# Patient Record
Sex: Male | Born: 1985 | Race: Black or African American | Hispanic: No | Marital: Single | Smoking: Never smoker
Health system: Southern US, Community
[De-identification: ages and names within clinical notes are randomized; demographics above are authoritative.]

---

## 2005-03-31 ENCOUNTER — Emergency Department (HOSPITAL_COMMUNITY): Admission: EM | Admit: 2005-03-31 | Discharge: 2005-03-31 | Payer: Self-pay | Admitting: Emergency Medicine

## 2007-06-01 ENCOUNTER — Emergency Department (HOSPITAL_COMMUNITY): Admission: EM | Admit: 2007-06-01 | Discharge: 2007-06-01 | Payer: Self-pay | Admitting: Emergency Medicine

## 2010-12-26 LAB — CBC
Hemoglobin: 13.7
Platelets: 210
RDW: 13.5

## 2010-12-26 LAB — URINALYSIS, ROUTINE W REFLEX MICROSCOPIC
Specific Gravity, Urine: >1.03 — ABNORMAL HIGH
Urobilinogen, UA: 0.2

## 2010-12-26 LAB — URINE MICROSCOPIC-ADD ON

## 2010-12-26 LAB — COMPREHENSIVE METABOLIC PANEL
ALT: 72 — ABNORMAL HIGH
AST: 113 — ABNORMAL HIGH
Albumin: 4
Alkaline Phosphatase: 60
Glucose, Bld: 106 — ABNORMAL HIGH
Potassium: 3.7
Sodium: 139
Total Protein: 7.1

## 2010-12-26 LAB — DIFFERENTIAL
Basophils Relative: 0
Eosinophils Absolute: 0.1
Eosinophils Relative: 1
Monocytes Absolute: 0.7
Monocytes Relative: 5

## 2011-01-28 ENCOUNTER — Ambulatory Visit (HOSPITAL_COMMUNITY)
Admission: RE | Admit: 2011-01-28 | Discharge: 2011-01-28 | Disposition: A | Discharge status: Home / Self Care | Payer: BC Managed Care – PPO | Source: Ambulatory Visit | Attending: Family Medicine | Admitting: Family Medicine

## 2011-01-28 ENCOUNTER — Encounter (HOSPITAL_COMMUNITY): Payer: Self-pay | Admitting: Family Medicine

## 2011-01-28 DIAGNOSIS — I369 Nonrheumatic tricuspid valve disorder, unspecified: Secondary | ICD-10-CM

## 2011-01-28 DIAGNOSIS — R0989 Other specified symptoms and signs involving the circulatory and respiratory systems: Secondary | ICD-10-CM | POA: Insufficient documentation

## 2011-01-28 DIAGNOSIS — R0789 Other chest pain: Secondary | ICD-10-CM | POA: Insufficient documentation

## 2011-01-28 DIAGNOSIS — R0609 Other forms of dyspnea: Secondary | ICD-10-CM | POA: Insufficient documentation

## 2011-01-28 NOTE — Progress Notes (Signed)
Stress Lab Nurses Notes - [facility]  Isaac Cochran 01/28/2011  Reason for doing test: Chest Pain  Type of test: Regular GTX  Nurse performing test: Parke Poisson, RN  Nuclear Medicine Tech: Not Applicable  Echo Tech: Not Applicable  MD performing test: Lilyan Punt  Family MD: Tessie Fass  Test explained and consent signed: yes  IV started: No IV started  Symptoms: SOB & Fatigue  Treatment/Intervention: None  Reason test stopped: fatigue, reached target HR and SOB  After recovery IV was: NA  Patient to return to Nuc. Med at : NA  Patient discharged: Home  Patient's Condition upon discharge was: stable  Comments: During test peak BP 162/72 & HR 191.   Recovery BP 12/68 & HR 112.  Symptoms resolved in recovery.  [name] T

## 2011-01-28 NOTE — Procedures (Signed)
NAME:  Isaac Cochran, Isaac Cochran              ACCOUNT NO.:  [id number]  MEDICAL RECORD NO.:  [id number]  LOCATION:  [facility]                          FACILITY:  [facility]  PHYSICIAN:  Ahsley Attwood A. Gerda Diss, MD    DATE OF BIRTH:  [birth year]  DATE OF PROCEDURE: DATE OF DISCHARGE:                                 STRESS TEST   PROCEDURE:  Stress test standard Bruce protocol.  REASON:  Dyspnea on exertion, intermittent chest pain.  Resting EKG, no acute ST-segment changes are seen.  Resting blood pressure 138/80.  HEART RATE RESPONSE TO EXERCISE:  The patient's heart rate went up quickly before even started exercise and heart rate was above 100, but as the patient exercised, it quickly went up and reached a peak of 190 by approximately 7 minutes 40 seconds into the Bruce protocol.  ST-SEGMENT RESPONSE TO EXERCISE:  The patient did not have any ST- segment changes.  No depressions or elevations.  ARRHYTHMIAS:  None.  Symptomatology - dyspnea with exertion.  Legs feeling tired, fatigued, denied chest pain or pressure.  Recovery phase is uneventful.  BLOOD PRESSURE RESPONSE TO EXERCISE:  Mild elevation in blood pressure during exercise.  Interpretation - poor exercise tolerance, but otherwise a normal stress test.  He was encouraged to start in operating regular exercise into his weekly routine and hopefully with time the patient will get himself into more cardiovascular shape.  His wife relates that he tends to have a lot of sleep apnea symptoms.  We will proceed that after we finish cardiac testing.  He will be getting an ultrasound of heart cause of dyspnea.     Elyssia Strausser A. Gerda Diss, MD    SAL/MEDQ  D:  01/28/2011  T:  01/28/2011  Job:  [id number]

## 2011-01-28 NOTE — Progress Notes (Signed)
*  PRELIMINARY RESULTS* Echocardiogram 2D Echocardiogram has been performed.  Isaac Cochran 01/28/2011, 8:44 AM

## 2013-11-03 ENCOUNTER — Encounter (HOSPITAL_COMMUNITY): Payer: Self-pay | Admitting: Emergency Medicine

## 2013-11-03 ENCOUNTER — Emergency Department (HOSPITAL_COMMUNITY): Payer: BC Managed Care – PPO

## 2013-11-03 ENCOUNTER — Emergency Department (HOSPITAL_COMMUNITY)
Admission: EM | Admit: 2013-11-03 | Discharge: 2013-11-03 | Disposition: A | Discharge status: Home / Self Care | Payer: BC Managed Care – PPO | Attending: Emergency Medicine | Admitting: Emergency Medicine

## 2013-11-03 DIAGNOSIS — S0993XA Unspecified injury of face, initial encounter: Secondary | ICD-10-CM | POA: Insufficient documentation

## 2013-11-03 DIAGNOSIS — Z880 Allergy status to penicillin: Secondary | ICD-10-CM | POA: Insufficient documentation

## 2013-11-03 DIAGNOSIS — M542 Cervicalgia: Secondary | ICD-10-CM

## 2013-11-03 DIAGNOSIS — Y9389 Activity, other specified: Secondary | ICD-10-CM | POA: Insufficient documentation

## 2013-11-03 DIAGNOSIS — Z043 Encounter for examination and observation following other accident: Secondary | ICD-10-CM | POA: Insufficient documentation

## 2013-11-03 DIAGNOSIS — S199XXA Unspecified injury of neck, initial encounter: Principal | ICD-10-CM

## 2013-11-03 DIAGNOSIS — Y9289 Other specified places as the place of occurrence of the external cause: Secondary | ICD-10-CM | POA: Insufficient documentation

## 2013-11-03 MED ORDER — IBUPROFEN 800 MG PO TABS: 800.0000 mg | ORAL_TABLET | Freq: Three times a day (TID) | ORAL | Status: DC | PRN

## 2013-11-03 MED ORDER — CYCLOBENZAPRINE HCL 10 MG PO TABS: 10.0000 mg | ORAL_TABLET | Freq: Three times a day (TID) | ORAL | Status: DC | PRN

## 2013-11-03 MED ORDER — CYCLOBENZAPRINE HCL 10 MG PO TABS
10.0000 mg | ORAL_TABLET | Freq: Once | ORAL | Status: AC
  Administered 2013-11-03: 10 mg via ORAL
  Filled 2013-11-03: qty 1
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
## 2013-11-04 NOTE — ED Provider Notes (Signed)
Medical screening examination/treatment/procedure(s) were performed by non-physician practitioner and as supervising physician I was immediately available for consultation/collaboration.  Earle Burson L Gift Rueckert, MD 11/04/13 0017 

## 2013-11-05 ENCOUNTER — Encounter (HOSPITAL_COMMUNITY): Payer: Self-pay | Admitting: Emergency Medicine

## 2013-11-05 ENCOUNTER — Emergency Department (HOSPITAL_COMMUNITY)
Admission: EM | Admit: 2013-11-05 | Discharge: 2013-11-06 | Disposition: A | Discharge status: Home / Self Care | Payer: BC Managed Care – PPO | Attending: Emergency Medicine | Admitting: Emergency Medicine

## 2013-11-05 DIAGNOSIS — S199XXA Unspecified injury of neck, initial encounter: Principal | ICD-10-CM

## 2013-11-05 DIAGNOSIS — Z880 Allergy status to penicillin: Secondary | ICD-10-CM | POA: Insufficient documentation

## 2013-11-05 DIAGNOSIS — Z79899 Other long term (current) drug therapy: Secondary | ICD-10-CM | POA: Insufficient documentation

## 2013-11-05 DIAGNOSIS — S0993XA Unspecified injury of face, initial encounter: Secondary | ICD-10-CM | POA: Insufficient documentation

## 2013-11-05 DIAGNOSIS — M542 Cervicalgia: Secondary | ICD-10-CM

## 2013-11-05 DIAGNOSIS — Y9289 Other specified places as the place of occurrence of the external cause: Secondary | ICD-10-CM | POA: Insufficient documentation

## 2013-11-05 DIAGNOSIS — Y9389 Activity, other specified: Secondary | ICD-10-CM | POA: Insufficient documentation

## 2013-11-05 DIAGNOSIS — Z791 Long term (current) use of non-steroidal anti-inflammatories (NSAID): Secondary | ICD-10-CM | POA: Insufficient documentation
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
## 2013-11-06 ENCOUNTER — Encounter: Payer: Self-pay | Admitting: Family Medicine

## 2013-11-06 ENCOUNTER — Ambulatory Visit (INDEPENDENT_AMBULATORY_CARE_PROVIDER_SITE_OTHER): Payer: BC Managed Care – PPO | Admitting: Family Medicine

## 2013-11-06 VITALS — BP 132/90 | Ht 66.5 in | Wt 305.0 lb

## 2013-11-06 DIAGNOSIS — S161XXA Strain of muscle, fascia and tendon at neck level, initial encounter: Secondary | ICD-10-CM

## 2013-11-06 DIAGNOSIS — S139XXA Sprain of joints and ligaments of unspecified parts of neck, initial encounter: Secondary | ICD-10-CM

## 2013-11-06 MED ORDER — HYDROCODONE-ACETAMINOPHEN 10-325 MG PO TABS: 1.0000 | ORAL_TABLET | ORAL | Status: DC | PRN
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
## 2013-11-06 NOTE — ED Provider Notes (Signed)
Medical screening examination/treatment/procedure(s) were performed by non-physician practitioner and as supervising physician I was immediately available for consultation/collaboration.   EKG Interpretation None       Bejamin Hackbart K Annessa Satre-Rasch, MD 11/06/13 [id number] 

## 2013-11-20 ENCOUNTER — Encounter: Payer: Self-pay | Admitting: Family Medicine

## 2013-11-20 ENCOUNTER — Ambulatory Visit (INDEPENDENT_AMBULATORY_CARE_PROVIDER_SITE_OTHER): Payer: BC Managed Care – PPO | Admitting: Family Medicine

## 2013-11-20 VITALS — BP 128/88 | Ht 66.5 in | Wt 306.0 lb

## 2013-11-20 DIAGNOSIS — M545 Low back pain, unspecified: Secondary | ICD-10-CM

## 2013-11-20 DIAGNOSIS — S39012S Strain of muscle, fascia and tendon of lower back, sequela: Secondary | ICD-10-CM
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
## 2013-11-20 NOTE — Patient Instructions (Signed)
DASH Eating Plan °DASH stands for "Dietary Approaches to Stop Hypertension." The DASH eating plan is a healthy eating plan that has been shown to reduce high blood pressure (hypertension). Additional health benefits may include reducing the risk of type 2 diabetes mellitus, heart disease, and stroke. The DASH eating plan may also help with weight loss. °WHAT DO I NEED TO KNOW ABOUT THE DASH EATING PLAN? °For the DASH eating plan, you will follow these general guidelines: °· Choose foods with a percent daily value for sodium of less than 5% (as listed on the food label). °· Use salt-free seasonings or herbs instead of table salt or sea salt. °· Check with your health care provider or pharmacist before using salt substitutes. °· Eat lower-sodium products, often labeled as "lower sodium" or "no salt added." °· Eat fresh foods. °· Eat more vegetables, fruits, and low-fat dairy products. °· Choose whole grains. Look for the word "whole" as the first word in the ingredient list. °· Choose fish and skinless chicken or turkey more often than red meat. Limit fish, poultry, and meat to 6 oz (170 g) each day. °· Limit sweets, desserts, sugars, and sugary drinks. °· Choose heart-healthy fats. °· Limit cheese to 1 oz (28 g) per day. °· Eat more home-cooked food and less restaurant, buffet, and fast food. °· Limit fried foods. °· Cook foods using methods other than frying. °· Limit canned vegetables. If you do use them, rinse them well to decrease the sodium. °· When eating at a restaurant, ask that your food be prepared with less salt, or no salt if possible. °WHAT FOODS CAN I EAT? °Seek help from a dietitian for individual calorie needs. °Grains °Whole grain or whole wheat bread. Brown rice. Whole grain or whole wheat pasta. Quinoa, bulgur, and whole grain cereals. Low-sodium cereals. Corn or whole wheat flour tortillas. Whole grain cornbread. Whole grain crackers. Low-sodium crackers. °Vegetables °Fresh or frozen vegetables  (raw, steamed, roasted, or grilled). Low-sodium or reduced-sodium tomato and vegetable juices. Low-sodium or reduced-sodium tomato sauce and paste. Low-sodium or reduced-sodium canned vegetables.  °Fruits °All fresh, canned (in natural juice), or frozen fruits. °Meat and Other Protein Products °Ground beef (85% or leaner), grass-fed beef, or beef trimmed of fat. Skinless chicken or turkey. Ground chicken or turkey. Pork trimmed of fat. All fish and seafood. Eggs. Dried beans, peas, or lentils. Unsalted nuts and seeds. Unsalted canned beans. °Dairy °Low-fat dairy products, such as skim or 1% milk, 2% or reduced-fat cheeses, low-fat ricotta or cottage cheese, or plain low-fat yogurt. Low-sodium or reduced-sodium cheeses. °Fats and Oils °Tub margarines without trans fats. Light or reduced-fat mayonnaise and salad dressings (reduced sodium). Avocado. Safflower, olive, or canola oils. Natural peanut or almond butter. °Other °Unsalted popcorn and pretzels. °The items listed above may not be a complete list of recommended foods or beverages. Contact your dietitian for more options. °WHAT FOODS ARE NOT RECOMMENDED? °Grains °White bread. White pasta. White rice. Refined cornbread. Bagels and croissants. Crackers that contain trans fat. °Vegetables °Creamed or fried vegetables. Vegetables in a cheese sauce. Regular canned vegetables. Regular canned tomato sauce and paste. Regular tomato and vegetable juices. °Fruits °Dried fruits. Canned fruit in light or heavy syrup. Fruit juice. °Meat and Other Protein Products °Fatty cuts of meat. Ribs, chicken wings, bacon, sausage, bologna, salami, chitterlings, fatback, hot dogs, bratwurst, and packaged luncheon meats. Salted nuts and seeds. Canned beans with salt. °Dairy °Whole or 2% milk, cream, half-and-half, and cream cheese. Whole-fat or sweetened yogurt. Full-fat   cheeses or blue cheese. Nondairy creamers and whipped toppings. Processed cheese, cheese spreads, or cheese  curds. °Condiments °Onion and garlic salt, seasoned salt, table salt, and sea salt. Canned and packaged gravies. Worcestershire sauce. Tartar sauce. Barbecue sauce. Teriyaki sauce. Soy sauce, including reduced sodium. Steak sauce. Fish sauce. Oyster sauce. Cocktail sauce. Horseradish. Ketchup and mustard. Meat flavorings and tenderizers. Bouillon cubes. Hot sauce. Tabasco sauce. Marinades. Taco seasonings. Relishes. °Fats and Oils °Butter, stick margarine, lard, shortening, ghee, and bacon fat. Coconut, palm kernel, or palm oils. Regular salad dressings. °Other °Pickles and olives. Salted popcorn and pretzels. °The items listed above may not be a complete list of foods and beverages to avoid. Contact your dietitian for more information. °WHERE CAN I FIND MORE INFORMATION? °National [employer], [employer], [employer]: www.nhlbi.nih.gov/health/health-topics/topics/dash/ °Document Released: 03/12/2011 Document Revised: 08/07/2013 Document Reviewed: 01/25/2013 °Exit[employer]® Patient Information ©2015 [employer]. This information is not intended to replace advice given to you by your health care provider. Make sure you discuss any questions you have with your health care provider. ° °

## 2013-12-19 ENCOUNTER — Ambulatory Visit: Payer: BC Managed Care – PPO | Admitting: Family Medicine

## 2014-01-18 ENCOUNTER — Ambulatory Visit (INDEPENDENT_AMBULATORY_CARE_PROVIDER_SITE_OTHER): Payer: Self-pay | Admitting: Family Medicine

## 2014-01-18 ENCOUNTER — Encounter: Payer: Self-pay | Admitting: Family Medicine

## 2014-01-18 VITALS — BP 130/90 | Ht 66.5 in | Wt 301.4 lb

## 2014-01-18 DIAGNOSIS — M542 Cervicalgia: Secondary | ICD-10-CM

## 2014-01-18 DIAGNOSIS — M545 Low back pain, unspecified: Secondary | ICD-10-CM
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
## 2014-01-19 ENCOUNTER — Other Ambulatory Visit: Payer: Self-pay | Admitting: *Deleted

## 2014-01-19 DIAGNOSIS — M5489 Other dorsalgia: Secondary | ICD-10-CM

## 2014-05-03 DIAGNOSIS — Z029 Encounter for administrative examinations, unspecified: Secondary | ICD-10-CM

## 2020-02-14 ENCOUNTER — Emergency Department (HOSPITAL_COMMUNITY): Payer: BC Managed Care – PPO

## 2020-02-14 ENCOUNTER — Encounter (HOSPITAL_COMMUNITY): Payer: Self-pay

## 2020-02-14 ENCOUNTER — Inpatient Hospital Stay (HOSPITAL_COMMUNITY): Payer: BC Managed Care – PPO

## 2020-02-14 ENCOUNTER — Inpatient Hospital Stay (HOSPITAL_COMMUNITY)
Admission: EM | Admit: 2020-02-14 | Discharge: 2020-02-25 | DRG: 177 | Disposition: A | Discharge status: Home / Self Care | Payer: BC Managed Care – PPO | Attending: Family Medicine | Admitting: Family Medicine

## 2020-02-14 DIAGNOSIS — I160 Hypertensive urgency: Secondary | ICD-10-CM | POA: Diagnosis present

## 2020-02-14 DIAGNOSIS — J9601 Acute respiratory failure with hypoxia: Secondary | ICD-10-CM | POA: Diagnosis present

## 2020-02-14 DIAGNOSIS — U071 COVID-19: Secondary | ICD-10-CM | POA: Diagnosis present

## 2020-02-14 DIAGNOSIS — J1282 Pneumonia due to coronavirus disease 2019: Secondary | ICD-10-CM | POA: Diagnosis present

## 2020-02-14 DIAGNOSIS — Z880 Allergy status to penicillin: Secondary | ICD-10-CM | POA: Diagnosis not present

## 2020-02-14 DIAGNOSIS — K760 Fatty (change of) liver, not elsewhere classified: Secondary | ICD-10-CM | POA: Diagnosis present

## 2020-02-14 DIAGNOSIS — Z6841 Body Mass Index (BMI) 40.0 and over, adult: Secondary | ICD-10-CM | POA: Diagnosis not present

## 2020-02-14 DIAGNOSIS — J189 Pneumonia, unspecified organism: Secondary | ICD-10-CM

## 2020-02-14 DIAGNOSIS — R7989 Other specified abnormal findings of blood chemistry: Secondary | ICD-10-CM

## 2020-02-14 DIAGNOSIS — K219 Gastro-esophageal reflux disease without esophagitis: Secondary | ICD-10-CM | POA: Diagnosis present

## 2020-02-14 DIAGNOSIS — G4700 Insomnia, unspecified: Secondary | ICD-10-CM | POA: Diagnosis present

## 2020-02-14 LAB — COMPREHENSIVE METABOLIC PANEL
ALT: 66 U/L — ABNORMAL HIGH (ref 0–44)
AST: 109 U/L — ABNORMAL HIGH (ref 15–41)
Albumin: 3.8 g/dL (ref 3.5–5.0)
Alkaline Phosphatase: 41 U/L (ref 38–126)
Anion gap: 13 (ref 5–15)
BUN: 22 mg/dL — ABNORMAL HIGH (ref 6–20)
CO2: 25 mmol/L (ref 22–32)
Calcium: 8.5 mg/dL — ABNORMAL LOW (ref 8.9–10.3)
Chloride: 104 mmol/L (ref 98–111)
Creatinine, Ser: 1.14 mg/dL (ref 0.61–1.24)
GFR, Estimated: >60 mL/min (ref >60)
Glucose, Bld: 118 mg/dL — ABNORMAL HIGH (ref 70–99)
Potassium: 4.2 mmol/L (ref 3.5–5.1)
Sodium: 142 mmol/L (ref 135–145)
Total Bilirubin: 0.6 mg/dL (ref 0.3–1.2)
Total Protein: 8.3 g/dL — ABNORMAL HIGH (ref 6.5–8.1)

## 2020-02-14 LAB — RESPIRATORY PANEL BY RT PCR (FLU A&B, COVID)
Influenza A by PCR: NEGATIVE
Influenza B by PCR: NEGATIVE
SARS Coronavirus 2 by RT PCR: POSITIVE — AB

## 2020-02-14 LAB — CBC WITH DIFFERENTIAL/PLATELET
Abs Immature Granulocytes: 0.03 10*3/uL (ref 0.00–0.07)
Basophils Absolute: 0 10*3/uL (ref 0.0–0.1)
Basophils Relative: 0 %
Eosinophils Absolute: 0 10*3/uL (ref 0.0–0.5)
Eosinophils Relative: 0 %
HCT: 45 % (ref 39.0–52.0)
Hemoglobin: 13.9 g/dL (ref 13.0–17.0)
Immature Granulocytes: 0 %
Lymphocytes Relative: 9 %
Lymphs Abs: 0.8 10*3/uL (ref 0.7–4.0)
MCH: 27.5 pg (ref 26.0–34.0)
MCHC: 30.9 g/dL (ref 30.0–36.0)
MCV: 89.1 fL (ref 80.0–100.0)
Monocytes Absolute: 0.5 10*3/uL (ref 0.1–1.0)
Monocytes Relative: 6 %
Neutro Abs: 7.3 10*3/uL (ref 1.7–7.7)
Neutrophils Relative %: 85 %
Platelets: 179 10*3/uL (ref 150–400)
RBC: 5.05 MIL/uL (ref 4.22–5.81)
RDW: 14.4 % (ref 11.5–15.5)
WBC: 8.6 10*3/uL (ref 4.0–10.5)
nRBC: 0.2 % (ref 0.0–0.2)

## 2020-02-14 LAB — FERRITIN: Ferritin: 755 ng/mL — ABNORMAL HIGH (ref 24–336)

## 2020-02-14 LAB — POC SARS CORONAVIRUS 2 AG -  ED: SARS Coronavirus 2 Ag: POSITIVE — AB

## 2020-02-14 LAB — LACTATE DEHYDROGENASE: LDH: 718 U/L — ABNORMAL HIGH (ref 98–192)

## 2020-02-14 LAB — PROCALCITONIN: Procalcitonin: 0.24 ng/mL

## 2020-02-14 LAB — FIBRINOGEN: Fibrinogen: 615 mg/dL — ABNORMAL HIGH (ref 210–475)

## 2020-02-14 LAB — TRIGLYCERIDES: Triglycerides: 151 mg/dL — ABNORMAL HIGH (ref <150)

## 2020-02-14 LAB — D-DIMER, QUANTITATIVE: D-Dimer, Quant: 2.03 ug/mL-FEU — ABNORMAL HIGH (ref 0.00–0.50)

## 2020-02-14 LAB — LACTIC ACID, PLASMA
Lactic Acid, Venous: 1.6 mmol/L (ref 0.5–1.9)
Lactic Acid, Venous: 1.3 mmol/L (ref 0.5–1.9)

## 2020-02-14 LAB — C-REACTIVE PROTEIN: CRP: 5.5 mg/dL — ABNORMAL HIGH (ref <1.0)

## 2020-02-14 MED ORDER — DEXAMETHASONE SODIUM PHOSPHATE 10 MG/ML IJ SOLN
10.0000 mg | Freq: Once | INTRAMUSCULAR | Status: AC
  Administered 2020-02-14: 10 mg via INTRAVENOUS
  Filled 2020-02-14: qty 1

## 2020-02-14 MED ORDER — IPRATROPIUM-ALBUTEROL 20-100 MCG/ACT IN AERS
1.0000 | INHALATION_SPRAY | Freq: Four times a day (QID) | RESPIRATORY_TRACT | Status: DC
  Administered 2020-02-14 – 2020-02-18 (×15): 1 via RESPIRATORY_TRACT
  Filled 2020-02-14: qty 4

## 2020-02-14 MED ORDER — ACETAMINOPHEN 500 MG PO TABS
1000.0000 mg | ORAL_TABLET | Freq: Once | ORAL | Status: AC
  Administered 2020-02-14: 1000 mg via ORAL
  Filled 2020-02-14: qty 2

## 2020-02-14 MED ORDER — ACETAMINOPHEN 325 MG PO TABS: 650.0000 mg | ORAL_TABLET | Freq: Once | ORAL | Status: DC

## 2020-02-14 MED ORDER — IOHEXOL 350 MG/ML SOLN
100.0000 mL | Freq: Once | INTRAVENOUS | Status: AC | PRN
  Administered 2020-02-14: 100 mL via INTRAVENOUS

## 2020-02-14 MED ORDER — ACETAMINOPHEN 325 MG PO TABS
650.0000 mg | ORAL_TABLET | Freq: Four times a day (QID) | ORAL | Status: DC | PRN
  Administered 2020-02-21: 650 mg via ORAL
  Filled 2020-02-14: qty 2

## 2020-02-14 MED ORDER — AZITHROMYCIN 250 MG PO TABS
500.0000 mg | ORAL_TABLET | ORAL | Status: AC
  Administered 2020-02-14 – 2020-02-18 (×5): 500 mg via ORAL
  Filled 2020-02-14 (×5): qty 2

## 2020-02-14 MED ORDER — GUAIFENESIN-DM 100-10 MG/5ML PO SYRP: 10.0000 mL | ORAL_SOLUTION | ORAL | Status: DC | PRN

## 2020-02-14 MED ORDER — DEXAMETHASONE SODIUM PHOSPHATE 10 MG/ML IJ SOLN
6.0000 mg | INTRAMUSCULAR | Status: DC
  Administered 2020-02-15: 6 mg via INTRAVENOUS
  Filled 2020-02-14: qty 1

## 2020-02-14 MED ORDER — LABETALOL HCL 5 MG/ML IV SOLN
10.0000 mg | INTRAVENOUS | Status: DC | PRN
  Administered 2020-02-14: 10 mg via INTRAVENOUS
  Filled 2020-02-14: qty 4

## 2020-02-14 MED ORDER — SODIUM CHLORIDE 0.9% FLUSH
3.0000 mL | Freq: Two times a day (BID) | INTRAVENOUS | Status: DC
  Administered 2020-02-14 – 2020-02-25 (×22): 3 mL via INTRAVENOUS

## 2020-02-14 MED ORDER — SODIUM CHLORIDE 0.9 % IV SOLN: 100.0000 mg | Freq: Every day | INTRAVENOUS | Status: DC

## 2020-02-14 MED ORDER — SODIUM CHLORIDE 0.9 % IV SOLN
200.0000 mg | Freq: Once | INTRAVENOUS | Status: AC
  Administered 2020-02-14: 200 mg via INTRAVENOUS
  Filled 2020-02-14: qty 40

## 2020-02-14 MED ORDER — ENOXAPARIN SODIUM 40 MG/0.4ML ~~LOC~~ SOLN
40.0000 mg | SUBCUTANEOUS | Status: DC
  Administered 2020-02-14: 40 mg via SUBCUTANEOUS
  Filled 2020-02-14: qty 0.4

## 2020-02-14 MED ORDER — ALBUTEROL SULFATE HFA 108 (90 BASE) MCG/ACT IN AERS
4.0000 | INHALATION_SPRAY | RESPIRATORY_TRACT | Status: AC
  Administered 2020-02-14 (×3): 4 via RESPIRATORY_TRACT
  Filled 2020-02-14: qty 6.7

## 2020-02-14 MED ORDER — BARICITINIB 2 MG PO TABS
4.0000 mg | ORAL_TABLET | Freq: Every day | ORAL | Status: DC
  Administered 2020-02-14 – 2020-02-25 (×12): 4 mg via ORAL
  Filled 2020-02-14 (×12): qty 2

## 2020-02-14 MED ORDER — SODIUM CHLORIDE 0.9 % IV SOLN
2.0000 g | INTRAVENOUS | Status: AC
  Administered 2020-02-14 – 2020-02-18 (×5): 2 g via INTRAVENOUS
  Filled 2020-02-14: qty 2
  Filled 2020-02-14 (×5): qty 20

## 2020-02-14 NOTE — H&P (Addendum)
History and Physical        Hospital Admission Note Date: 02/14/2020  Patient name: Isaac Cochran Medical record number: [id number] Date of birth: 07/15/1985 Age: 34 y.o. Gender: male  PCP: Pcp, No    Chief Complaint    Chief Complaint  Patient presents with  . Shortness of Breath  . Fever      HPI:   This is a 35 year old male who is unvaccinated against COVID-19 without any significant past medical history who presented to the ED with 2 weeks of shortness of breath from urgent care.  Has had low-grade fevers at home.  Productive cough as well.  Admits to diarrhea and change in taste and generalized abdominal pain with some nausea.  Has a history of asthma.  Denies tobacco use.  No history of blood clots.  ED Course: T10 2.8 F, HR 122, RR 38, BP up to 161/101, SPO2 in the 50s at presentation requiring up to 15 LPM HFNC with NRB. Labs: AST 109, ALT 66, LDH 718, triglycerides 151, lactic acid 1.7, WBC 8.6, D-dimer 2.03, fibrinogen 615, COVID-19 positive. CXR: Widespread airspace opacity bilaterally suspected multifocal pneumonia, pulmonary edema cannot be excluded, heart upper normal size. He was given Tylenol, albuterol, dexamethasone.  Vitals:   02/14/20 1242 02/14/20 1300  BP: (!) 161/101 (!) 152/93  Pulse:  (!) 121  Resp: (!) 22 (!) 34  Temp:    SpO2: 92% 90%     Review of Systems:  Review of Systems  Constitutional: Negative for fever.  Cardiovascular: Negative for chest pain, palpitations and leg swelling.  Gastrointestinal: Positive for abdominal pain. Negative for vomiting.  Genitourinary: Negative.   All other systems reviewed and are negative.   Medical/Social/Family History   Past Medical History: History reviewed. No pertinent past medical history.  History reviewed. No pertinent surgical history.  Medications: Prior to Admission medications     Medication Sig Start Date End Date Taking? Authorizing Provider  acetaminophen (TYLENOL) 500 MG tablet Take 500 mg by mouth every 6 (six) hours as needed for mild pain.   Yes [provider]  Ascorbic Acid (VITAMIN C) 1000 MG tablet Take 1,000 mg by mouth daily.   Yes [provider]  DM-Doxylamine-Acetaminophen (NYQUIL HBP COLD & FLU) 15-6.25-325 MG/15ML LIQD Take 15 mLs by mouth every 4 (four) hours as needed (cold symptoms).   Yes [provider]  ELDERBERRY PO Take 1 capsule by mouth daily.   Yes [provider]  Omega-3 Fatty Acids (FISH OIL) 1000 MG CAPS Take 1,000 mg by mouth daily.   Yes [provider]  HYDROcodone-acetaminophen (NORCO) 10-325 MG per tablet Take 1 tablet by mouth every 4 (four) hours as needed. 11/06/13   Babs Sciara, MD    Allergies:   Allergies  Allergen Reactions  . Amoxicillin Hives  . Amoxicillin-Pot Clavulanate Hives  . Penicillins Rash    Social History:  reports that he has never smoked. He has never used smokeless tobacco. He reports that he does not drink alcohol and does not use drugs.  Family History: History reviewed. No pertinent family history.   Objective   Physical Exam: Blood pressure (!) 152/93, pulse (!) 121, temperature (!) 102.8  F (39.3 C), temperature source Oral, resp. rate (!) 34, SpO2 90 %.  Physical Exam Vitals and nursing note reviewed.  Constitutional:      Appearance: Normal appearance. He is obese. He is not ill-appearing.  HENT:     Head: Normocephalic and atraumatic.  Eyes:     Conjunctiva/sclera: Conjunctivae normal.  Cardiovascular:     Rate and Rhythm: Regular rhythm. Tachycardia present.  Pulmonary:     Effort: Pulmonary effort is normal. No tachypnea.     Comments: SPO2 89%, corrected with sitting upright Abdominal:     General: Abdomen is flat.     Palpations: Abdomen is soft.  Musculoskeletal:        General: No swelling or tenderness.  Skin:     Coloration: Skin is not jaundiced or pale.  Neurological:     Mental Status: He is alert. Mental status is at baseline.  Psychiatric:        Mood and Affect: Mood normal.        Behavior: Behavior normal.     LABS on Admission: I have personally reviewed all the labs and imaging below    Basic Metabolic Panel: Recent Labs  Lab 02/14/20 1141  NA 142  K 4.2  CL 104  CO2 25  GLUCOSE 118*  BUN 22*  CREATININE 1.14  CALCIUM 8.5*   Liver Function Tests: Recent Labs  Lab 02/14/20 1141  AST 109*  ALT 66*  ALKPHOS 41  BILITOT 0.6  PROT 8.3*  ALBUMIN 3.8   No results for input(s): LIPASE, AMYLASE in the last 168 hours. No results for input(s): AMMONIA in the last 168 hours. CBC: Recent Labs  Lab 02/14/20 1141  WBC 8.6  NEUTROABS 7.3  HGB 13.9  HCT 45.0  MCV 89.1  PLT 179   Cardiac Enzymes: No results for input(s): CKTOTAL, CKMB, CKMBINDEX, TROPONINI in the last 168 hours. BNP: Invalid input(s): POCBNP CBG: No results for input(s): GLUCAP in the last 168 hours.  Radiological Exams on Admission:  DG Chest Port 1 View  Result Date: 02/14/2020 CLINICAL DATA:  Hypoxia and shortness of breath EXAM: PORTABLE CHEST 1 VIEW COMPARISON:  None. FINDINGS: There is airspace opacity throughout the lungs bilaterally without appreciable consolidation. Heart is upper normal in size with pulmonary vascularity normal. No adenopathy. No bone lesions. IMPRESSION: Widespread airspace opacity bilaterally. Suspect multifocal pneumonia, likely of atypical organism etiology. A degree of pulmonary edema superimposed cannot be excluded. Heart upper normal in size. No adenopathy evident. Electronically Signed   By: Bretta Bang III M.D.   On: 02/14/2020 12:19      EKG: Independently reviewed.    A & P   Principal Problem:   Acute hypoxemic respiratory failure due to COVID-19 [facility]) Active Problems:   Hypertensive urgency   SIRS  Acute hypoxic respiratory failure secondary to  COVID-19 O2 Requirements: 0 L/min at baseline, currently 15 L/min on HFNC with NRB SIRS criteria: Febrile, tachycardic, tachypnic. No leukocytosis or lactic acidosis - unlikely sepsis but need to watch closely CRP 5.5, D Dimer 2.0, Procalcitonin 0.2 (minimally elevated).  Risks/benefits of baricitinib discussed with the patient. Will add on both antibiotics and baricitinib for now and monitor over the next 24 hours.  Check a CTA chest to rule out PE with elevated D dimer fever, tachycardia, hypoxia Remdesivir x 5 days, Steroids x 10 days Inhalers, antitussives Encourage Incentive Spirometry, Flutter Valve and Pronation as tolerated  Hypertensive urgency A.   Labetalol as needed  DVT prophylaxis:  lovenox   Code Status: Not on file  Diet: Heart healthy Family Communication: Admission, patients condition and plan of care including tests being ordered have been discussed with the patient who indicates understanding and agrees with the plan and Code Status. Patient's wife was updated  Disposition Plan: The appropriate patient status for this patient is INPATIENT. Inpatient status is judged to be reasonable and necessary in order to provide the required intensity of service to ensure the patient's safety. The patient's presenting symptoms, physical exam findings, and initial radiographic and laboratory data in the context of their chronic comorbidities is felt to place them at high risk for further clinical deterioration. Furthermore, it is not anticipated that the patient will be medically stable for discharge from the hospital within 2 midnights of admission. The following factors support the patient status of inpatient.   " The patient's presenting symptoms include shortness of breath. " The worrisome physical exam findings include hypoxia. " The initial radiographic and laboratory data are worrisome because of multifocal pneumonia on CXR consistent with COVID-19, elevated inflammatory  markers. " The chronic co-morbidities include obesity.   * I certify that at the point of admission it is my clinical judgment that the patient will require inpatient hospital care spanning beyond 2 midnights from the point of admission due to high intensity of service, high risk for further deterioration and high frequency of surveillance required.*   Status is: Inpatient  Remains inpatient appropriate because:IV treatments appropriate due to intensity of illness or inability to take PO and Inpatient level of care appropriate due to severity of illness   Dispo: The patient is from: Home              Anticipated d/c is to: Home              Anticipated d/c date is: > 3 days              Patient currently is not medically stable to d/c.        Consultants  . none  Procedures  . none  Time Spent on Admission: 65 minutes    Jae Dire, DO [facility]  02/14/2020, 2:24 PM

## 2020-02-14 NOTE — ED Notes (Signed)
Transported patient to CT with CT tech and Press photographer. Pt back in room and on monitor

## 2020-02-14 NOTE — ED Triage Notes (Signed)
SOB and cough x 2 weeks. Seen at urgent care today. sats in the 50's on room air. In the 70's on 8L [place] per rescue. maintaing 98% on non-rebreather. Pt has not had a recent covid test. Denies covid vax. Pt also c/o exertional chest pain

## 2020-02-14 NOTE — ED Provider Notes (Signed)
[facility] Note   CSN: [id number] Arrival date & time: 02/14/20  1108     History Chief Complaint  Patient presents with  . Shortness of Breath  . Fever    Isaac Cochran is a 34 y.o. male. Presents to ER with shortness of breath, fever. Reports has been having symptoms for about 2 weeks, initially mild, cough, malaise, fatigue, nausea. Has been having worsening cough, difficulty in breathing over the past couple days. No chest pain. Denies history of Covid, has not been tested recently. Has not had Covid vaccine. Denies any chronic medical conditions.  HPI     History reviewed. No pertinent past medical history.  Patient Active Problem List   Diagnosis Date Noted  . Acute hypoxemic respiratory failure due to COVID-19 (HCC) 02/14/2020  . Hypertensive urgency 02/14/2020    History reviewed. No pertinent surgical history.     History reviewed. No pertinent family history.  Social History   Tobacco Use  . Smoking status: Never Smoker  . Smokeless tobacco: Never Used  Vaping Use  . Vaping Use: Never used  Substance Use Topics  . Alcohol use: No  . Drug use: No    Home Medications Prior to Admission medications   Medication Sig Start Date End Date Taking? Authorizing Provider  acetaminophen (TYLENOL) 500 MG tablet Take 500 mg by mouth every 6 (six) hours as needed for mild pain.   Yes [provider]  Ascorbic Acid (VITAMIN C) 1000 MG tablet Take 1,000 mg by mouth daily.   Yes [provider]  DM-Doxylamine-Acetaminophen (NYQUIL HBP COLD & FLU) 15-6.25-325 MG/15ML LIQD Take 15 mLs by mouth every 4 (four) hours as needed (cold symptoms).   Yes [provider]  ELDERBERRY PO Take 1 capsule by mouth daily.   Yes [provider]  Omega-3 Fatty Acids (FISH OIL) 1000 MG CAPS Take 1,000 mg by mouth daily.   Yes [provider]  HYDROcodone-acetaminophen (NORCO) 10-325 MG per tablet  Take 1 tablet by mouth every 4 (four) hours as needed. Patient not taking: Reported on 02/14/2020 11/06/13   Babs Sciara, MD    Allergies    Amoxicillin, Amoxicillin-pot clavulanate, and Penicillins  Review of Systems   Review of Systems  Constitutional: Positive for chills and fever.  HENT: Negative for ear pain and sore throat.   Eyes: Negative for pain and visual disturbance.  Respiratory: Positive for cough and shortness of breath.   Cardiovascular: Negative for chest pain and palpitations.  Gastrointestinal: Negative for abdominal pain and vomiting.  Genitourinary: Negative for dysuria and hematuria.  Musculoskeletal: Positive for arthralgias, back pain and myalgias.  Skin: Negative for color change and rash.  Neurological: Negative for seizures and syncope.  All other systems reviewed and are negative.   Physical Exam Updated Vital Signs BP 111/75   Pulse 99   Temp 98.6 F (37 C) (Oral)   Resp (!) 26   SpO2 (!) 89%   Physical Exam Vitals and nursing note reviewed.  Constitutional:      Appearance: He is well-developed.  HENT:     Head: Normocephalic and atraumatic.  Eyes:     Conjunctiva/sclera: Conjunctivae normal.  Cardiovascular:     Rate and Rhythm: Regular rhythm. Tachycardia present.     Heart sounds: No murmur heard.   Pulmonary:     Comments: Mild tachypnea, coarse breath sounds bilaterally, speaking in full sentences Abdominal:     Palpations: Abdomen is soft.  Tenderness: There is no abdominal tenderness.  Musculoskeletal:     Cervical back: Neck supple.  Skin:    General: Skin is warm and dry.  Neurological:     General: No focal deficit present.     Mental Status: He is alert.  Psychiatric:        Mood and Affect: Mood normal.        Behavior: Behavior normal.     ED Results / Procedures / Treatments   Labs (all labs ordered are listed, but only abnormal results are displayed) Labs Reviewed  RESPIRATORY PANEL BY RT PCR (FLU A&B,  COVID) - Abnormal; Notable for the following components:      Result Value   SARS Coronavirus 2 by RT PCR POSITIVE (*)    All other components within normal limits  COMPREHENSIVE METABOLIC PANEL - Abnormal; Notable for the following components:   Glucose, Bld 118 (*)    BUN 22 (*)    Calcium 8.5 (*)    Total Protein 8.3 (*)    AST 109 (*)    ALT 66 (*)    All other components within normal limits  D-DIMER, QUANTITATIVE (NOT AT [facility]) - Abnormal; Notable for the following components:   D-Dimer, Quant 2.03 (*)    All other components within normal limits  LACTATE DEHYDROGENASE - Abnormal; Notable for the following components:   LDH 718 (*)    All other components within normal limits  FERRITIN - Abnormal; Notable for the following components:   Ferritin 755 (*)    All other components within normal limits  TRIGLYCERIDES - Abnormal; Notable for the following components:   Triglycerides 151 (*)    All other components within normal limits  FIBRINOGEN - Abnormal; Notable for the following components:   Fibrinogen 615 (*)    All other components within normal limits  C-REACTIVE PROTEIN - Abnormal; Notable for the following components:   CRP 5.5 (*)    All other components within normal limits  POC SARS CORONAVIRUS 2 AG -  ED - Abnormal; Notable for the following components:   SARS Coronavirus 2 Ag POSITIVE (*)    All other components within normal limits  LACTIC ACID, PLASMA  LACTIC ACID, PLASMA  CBC WITH DIFFERENTIAL/PLATELET  PROCALCITONIN    EKG None  Radiology DG Chest Port 1 View  Result Date: 02/14/2020 CLINICAL DATA:  Hypoxia and shortness of breath EXAM: PORTABLE CHEST 1 VIEW COMPARISON:  None. FINDINGS: There is airspace opacity throughout the lungs bilaterally without appreciable consolidation. Heart is upper normal in size with pulmonary vascularity normal. No adenopathy. No bone lesions. IMPRESSION: Widespread airspace opacity bilaterally. Suspect multifocal  pneumonia, likely of atypical organism etiology. A degree of pulmonary edema superimposed cannot be excluded. Heart upper normal in size. No adenopathy evident. Electronically Signed   By: Bretta Bang III M.D.   On: 02/14/2020 12:19    Procedures .Critical Care Performed by: Milagros Loll, MD Authorized by: Milagros Loll, MD   Critical care provider statement:    Critical care time (minutes):  40   Critical care was necessary to treat or prevent imminent or life-threatening deterioration of the following conditions:  Respiratory failure   Critical care was time spent personally by me on the following activities:  Discussions with consultants, evaluation of patient's response to treatment, examination of patient, ordering and performing treatments and interventions, ordering and review of laboratory studies, ordering and review of radiographic studies, pulse oximetry, re-evaluation of patient's condition, obtaining  history from patient or surrogate and review of old charts   (including critical care time)  Medications Ordered in ED Medications  remdesivir 200 mg in sodium chloride 0.9% 250 mL IVPB (200 mg Intravenous New Bag/Given 02/14/20 1453)    Followed by  remdesivir 100 mg in sodium chloride 0.9 % 100 mL IVPB (has no administration in time range)  labetalol (NORMODYNE) injection 10 mg (10 mg Intravenous Given 02/14/20 1454)  cefTRIAXone (ROCEPHIN) 2 g in sodium chloride 0.9 % 100 mL IVPB (2 g Intravenous New Bag/Given 02/14/20 1514)  azithromycin (ZITHROMAX) tablet 500 mg (500 mg Oral Given 02/14/20 1514)  baricitinib (OLUMIANT) tablet 4 mg (has no administration in time range)  acetaminophen (TYLENOL) tablet 1,000 mg (1,000 mg Oral Given 02/14/20 1154)  albuterol (VENTOLIN HFA) 108 (90 Base) MCG/ACT inhaler 4 puff (4 puffs Inhalation Given 02/14/20 1242)  dexamethasone (DECADRON) injection 10 mg (10 mg Intravenous Given 02/14/20 1304)    ED Course  I have reviewed  the triage vital signs and the nursing notes.  Pertinent labs & imaging results that were available during my care of the patient were reviewed by me and considered in my medical decision making (see chart for details).    MDM Rules/Calculators/A&P                         34 year old male presenting to ER with concern for cough, fever, shortness of breath. Covid positive. Patient was profoundly hypoxic with EMS. His oxygen saturations improved with both nonrebreather and high flow nasal cannula. He is mentating well, speaking full sentences. CXR consistent with Covid pneumonia. Suspect this is etiology for his hypoxia today. Will admit to the hospitalist service for further management. Ordered Decadron, remdesivir.  Isaac Cochran was evaluated in Emergency Department on 02/14/2020 for the symptoms described in the history of present illness. He was evaluated in the context of the global COVID-19 pandemic, which necessitated consideration that the patient might be at risk for infection with the SARS-CoV-2 virus that causes COVID-19. Institutional protocols and algorithms that pertain to the evaluation of patients at risk for COVID-19 are in a state of rapid change based on information released by regulatory bodies including the CDC and federal and state organizations. These policies and algorithms were followed during the patient's care in the ED.   Final Clinical Impression(s) / ED Diagnoses Final diagnoses:  COVID-19  Pneumonia due to COVID-19 virus  Acute respiratory failure with hypoxia [facility])    Rx / DC Orders ED Discharge Orders    None       Milagros Loll, MD 02/14/20 [phone]

## 2020-02-15 DIAGNOSIS — I160 Hypertensive urgency: Secondary | ICD-10-CM | POA: Diagnosis not present

## 2020-02-15 DIAGNOSIS — U071 COVID-19: Secondary | ICD-10-CM | POA: Diagnosis not present

## 2020-02-15 DIAGNOSIS — J9601 Acute respiratory failure with hypoxia: Secondary | ICD-10-CM | POA: Diagnosis not present

## 2020-02-15 LAB — COMPREHENSIVE METABOLIC PANEL
ALT: 56 U/L — ABNORMAL HIGH (ref 0–44)
AST: 75 U/L — ABNORMAL HIGH (ref 15–41)
Albumin: 3.6 g/dL (ref 3.5–5.0)
Alkaline Phosphatase: 44 U/L (ref 38–126)
Anion gap: 9 (ref 5–15)
BUN: 22 mg/dL — ABNORMAL HIGH (ref 6–20)
CO2: 27 mmol/L (ref 22–32)
Calcium: 8.4 mg/dL — ABNORMAL LOW (ref 8.9–10.3)
Chloride: 106 mmol/L (ref 98–111)
Creatinine, Ser: 0.9 mg/dL (ref 0.61–1.24)
GFR, Estimated: >60 mL/min (ref >60)
Glucose, Bld: 127 mg/dL — ABNORMAL HIGH (ref 70–99)
Potassium: 4.3 mmol/L (ref 3.5–5.1)
Sodium: 142 mmol/L (ref 135–145)
Total Bilirubin: 0.4 mg/dL (ref 0.3–1.2)
Total Protein: 8.1 g/dL (ref 6.5–8.1)

## 2020-02-15 LAB — CBC WITH DIFFERENTIAL/PLATELET
Abs Immature Granulocytes: 0.04 10*3/uL (ref 0.00–0.07)
Basophils Absolute: 0 10*3/uL (ref 0.0–0.1)
Basophils Relative: 0 %
Eosinophils Absolute: 0 10*3/uL (ref 0.0–0.5)
Eosinophils Relative: 0 %
HCT: 44.2 % (ref 39.0–52.0)
Hemoglobin: 13.9 g/dL (ref 13.0–17.0)
Immature Granulocytes: 0 %
Lymphocytes Relative: 10 %
Lymphs Abs: 1 10*3/uL (ref 0.7–4.0)
MCH: 27.9 pg (ref 26.0–34.0)
MCHC: 31.4 g/dL (ref 30.0–36.0)
MCV: 88.6 fL (ref 80.0–100.0)
Monocytes Absolute: 0.5 10*3/uL (ref 0.1–1.0)
Monocytes Relative: 5 %
Neutro Abs: 9 10*3/uL — ABNORMAL HIGH (ref 1.7–7.7)
Neutrophils Relative %: 85 %
Platelets: 193 10*3/uL (ref 150–400)
RBC: 4.99 MIL/uL (ref 4.22–5.81)
RDW: 14.4 % (ref 11.5–15.5)
WBC: 10.6 10*3/uL — ABNORMAL HIGH (ref 4.0–10.5)
nRBC: 0.2 % (ref 0.0–0.2)

## 2020-02-15 LAB — C-REACTIVE PROTEIN: CRP: 6 mg/dL — ABNORMAL HIGH (ref <1.0)

## 2020-02-15 LAB — FERRITIN: Ferritin: 804 ng/mL — ABNORMAL HIGH (ref 24–336)

## 2020-02-15 LAB — D-DIMER, QUANTITATIVE: D-Dimer, Quant: 2.33 ug/mL-FEU — ABNORMAL HIGH (ref 0.00–0.50)

## 2020-02-15 MED ORDER — METHYLPREDNISOLONE SODIUM SUCC 125 MG IJ SOLR
125.0000 mg | Freq: Two times a day (BID) | INTRAMUSCULAR | Status: DC
  Administered 2020-02-15 – 2020-02-21 (×12): 125 mg via INTRAVENOUS
  Filled 2020-02-15 (×12): qty 2

## 2020-02-15 MED ORDER — ENOXAPARIN SODIUM 80 MG/0.8ML ~~LOC~~ SOLN
65.0000 mg | SUBCUTANEOUS | Status: DC
  Administered 2020-02-15: 65 mg via SUBCUTANEOUS
  Filled 2020-02-15: qty 0.8

## 2020-02-15 NOTE — Plan of Care (Signed)

## 2020-02-15 NOTE — Progress Notes (Signed)
PROGRESS NOTE  Isaac Cochran  BWG:[id number] DOB: 08-18-1985 DOA: 02/14/2020 PCP: Oneita Hurt, No   Brief Narrative: Isaac Cochran is a 34 y.o. male with a history of obesity who was evaluated at urgent care for 2 weeks of dyspnea and cough found to have room air SpO2 in 50%'s improved with NRB prompting transfer to ED on 02/14/2020. SARS-CoV-2 Ag and PCR were positive, inflammatory markers elevated (CRP 5.5, PCT 0.24), and CXR demonstrated bilateral airspace infiltrates. Ct revealed moderately severe diffuse GGOs and interstitial infiltrates without PE or lobar consolidation. He was admitted, started on remdesivir, steroids, baricitinib, and antibiotics. He's continued to require 15L HFNC and NRB with no worsening of respiratory effort.  Assessment & Plan: Principal Problem:   Acute hypoxemic respiratory failure due to COVID-19 [facility]) Active Problems:   Hypertensive urgency  Acute hypoxemic respiratory failure due to covid-19 pneumonia and possible superimposed CAP: SARS-CoV-2 PCR and Ag positive on 11/10, PCT elevated with air bronchograms on CT lung windows on my personal review.  - Continue remdesivir - Augment steroids - Continue baricitinib, started 11/10 - Continue empiric abx - Admit to PCU for close monitoring, they are capable of using HHF if needed there. Confirmed full code.  - Encourage OOB, IS, FV, and awake proning if able - Continue airborne, contact precautions for 21 days from positive testing. - Monitor CMP and inflammatory markers - Enoxaparin prophylactic dose.   Hypertensive urgency: Improving.  - prn labetalol  Obesity: Estimated body mass index is 48.1 kg/m as calculated from the following:   Height as of this encounter: 5\' 6"  (1.676 m).   Weight as of this encounter: 135.2 kg.  DVT prophylaxis: Lovenox 0.5mg /kg q24h Code Status: Full Family Communication: None at bedside Disposition Plan:  Status is: Inpatient  Remains inpatient appropriate  because:Inpatient level of care appropriate due to severity of illness  Dispo: The patient is from: Home              Anticipated d/c is to: Home              Anticipated d/c date is: > 3 days              Patient currently is not medically stable to d/c.  Consultants:   None  Procedures:   None  Antimicrobials:  Remdesivir, ceftriaxone, azithromycin   Subjective: Shortness of breath is stable, moderate and constant at rest, worse with exertion. For this reason, hasn't gotten up yet, using urinals for voiding. No chest pain or leg swelling.  Objective: Vitals:   02/15/20 1330 02/15/20 1400 02/15/20 1510 02/15/20 1612  BP: (!) 143/92 135/79 136/81 (!) 147/77  Pulse: 96 91 90 99  Resp: (!) 34 (!) 42 (!) 34 (!) 31  Temp:    97.9 F (36.6 C)  TempSrc:    Oral  SpO2: 94% 91% 95% 93%  Weight:      Height:        Intake/Output Summary (Last 24 hours) at 02/15/2020 1619 Last data filed at 02/15/2020 0900 Gross per 24 hour  Intake 103 ml  Output --  Net 103 ml   Filed Weights   02/15/20 0005  Weight: 135.2 kg    Gen: 34 y.o. male in no distress Pulm: Mildly labored tachypnea with diffuse crackles.  CV: Borderline regular tachycardia. No murmur, rub, or gallop. No JVD, no pedal edema. GI: Abdomen soft, non-tender, non-distended, with normoactive bowel sounds. No organomegaly or masses felt. Ext: Warm, no deformities Skin: No  rashes, lesions or ulcers Neuro: Alert and oriented. No focal neurological deficits. Psych: Judgement and insight appear normal. Mood & affect appropriate.   Data Reviewed: I have personally reviewed following labs and imaging studies  CBC: Recent Labs  Lab 02/14/20 1141 02/15/20 0659  WBC 8.6 10.6*  NEUTROABS 7.3 9.0*  HGB 13.9 13.9  HCT 45.0 44.2  MCV 89.1 88.6  PLT 179 193   Basic Metabolic Panel: Recent Labs  Lab 02/14/20 1141 02/15/20 0659  NA 142 142  K 4.2 4.3  CL 104 106  CO2 25 27  GLUCOSE 118* 127*  BUN 22* 22*   CREATININE 1.14 0.90  CALCIUM 8.5* 8.4*   GFR: Estimated Creatinine Clearance: 149.7 mL/min (by C-G formula based on SCr of 0.9 mg/dL). Liver Function Tests: Recent Labs  Lab 02/14/20 1141 02/15/20 0659  AST 109* 75*  ALT 66* 56*  ALKPHOS 41 44  BILITOT 0.6 0.4  PROT 8.3* 8.1  ALBUMIN 3.8 3.6   No results for input(s): LIPASE, AMYLASE in the last 168 hours. No results for input(s): AMMONIA in the last 168 hours. Coagulation Profile: No results for input(s): INR, PROTIME in the last 168 hours. Cardiac Enzymes: No results for input(s): CKTOTAL, CKMB, CKMBINDEX, TROPONINI in the last 168 hours. BNP (last 3 results) No results for input(s): PROBNP in the last 8760 hours. HbA1C: No results for input(s): HGBA1C in the last 72 hours. CBG: No results for input(s): GLUCAP in the last 168 hours. Lipid Profile: Recent Labs    02/14/20 1141  TRIG 151*   Thyroid Function Tests: No results for input(s): TSH, T4TOTAL, FREET4, T3FREE, THYROIDAB in the last 72 hours. Anemia Panel: Recent Labs    02/14/20 1141 02/15/20 0659  FERRITIN 755* 804*   Urine analysis:    Component Value Date/Time   COLORURINE YELLOW 06/01/2007 0606   APPEARANCEUR CLEAR 06/01/2007 0606   LABSPEC >1.030 (H) 06/01/2007 0606   PHURINE 6.0 06/01/2007 0606   GLUCOSEU NEGATIVE 06/01/2007 0606   HGBUR MODERATE (A) 06/01/2007 0606   BILIRUBINUR SMALL (A) 06/01/2007 0606   KETONESUR NEGATIVE 06/01/2007 0606   PROTEINUR NEGATIVE 06/01/2007 0606   UROBILINOGEN 0.2 06/01/2007 0606   NITRITE NEGATIVE 06/01/2007 0606   LEUKOCYTESUR NEGATIVE 06/01/2007 0606   Recent Results (from the past 240 hour(s))  Respiratory Panel by RT PCR (Flu A&B, Covid) - Nasopharyngeal Swab     Status: Abnormal   Collection Time: 02/14/20 11:41 AM   Specimen: Nasopharyngeal Swab  Result Value Ref Range Status   SARS Coronavirus 2 by RT PCR POSITIVE (A) NEGATIVE Final    Comment: RESULT CALLED TO, READ BACK BY AND VERIFIED  WITH: WEST,S. RN @1335  02/14/20 BILLINGSLEY,L (NOTE) SARS-CoV-2 target nucleic acids are DETECTED.  SARS-CoV-2 RNA is generally detectable in upper respiratory specimens  during the acute phase of infection. Positive results are indicative of the presence of the identified virus, but do not rule out bacterial infection or co-infection with other pathogens not detected by the test. Clinical correlation with patient history and other diagnostic information is necessary to determine patient infection status. The expected result is Negative.  Fact Sheet for Patients:  13/10/21  Fact Sheet for Healthcare Providers: https://www.moore.com/  This test is not yet approved or cleared by the https://www.young.biz/ FDA and  has been authorized for detection and/or diagnosis of SARS-CoV-2 by FDA under an Emergency Use Authorization (EUA).  This EUA will remain in effect (meaning this test ca n be used) for the duration of  the COVID-19 declaration under Section 564(b)(1) of the Act, 21 U.S.C. section 360bbb-3(b)(1), unless the authorization is terminated or revoked sooner.      Influenza A by PCR NEGATIVE NEGATIVE Final   Influenza B by PCR NEGATIVE NEGATIVE Final    Comment: (NOTE) The Xpert Xpress SARS-CoV-2/FLU/RSV assay is intended as an aid in  the diagnosis of influenza from Nasopharyngeal swab specimens and  should not be used as a sole basis for treatment. Nasal washings and  aspirates are unacceptable for Xpert Xpress SARS-CoV-2/FLU/RSV  testing.  Fact Sheet for Patients: https://www.moore.com/  Fact Sheet for Healthcare Providers: https://www.young.biz/  This test is not yet approved or cleared by the [place] FDA and  has been authorized for detection and/or diagnosis of SARS-CoV-2 by  FDA under an Emergency Use Authorization (EUA). This EUA will remain  in effect (meaning this test can  be used) for the duration of the  Covid-19 declaration under Section 564(b)(1) of the Act, 21  U.S.C. section 360bbb-3(b)(1), unless the authorization is  terminated or revoked. Performed at [facility], [address], [place], [place] [address]       Radiology Studies: CT ANGIO CHEST PE W OR WO CONTRAST  Result Date: 02/14/2020 CLINICAL DATA:  Shortness of breath for 2 weeks, low-grade fever, productive cough EXAM: CT ANGIOGRAPHY CHEST WITH CONTRAST TECHNIQUE: Multidetector CT imaging of the chest was performed using the standard protocol during bolus administration of intravenous contrast. Multiplanar CT image reconstructions and MIPs were obtained to evaluate the vascular anatomy. CONTRAST:  OMNIPAQUE IOHEXOL 350 MG/ML SOLN COMPARISON:  02/14/2020 FINDINGS: Cardiovascular: This is a technically adequate evaluation of the pulmonary vasculature. There are no filling defects or pulmonary emboli. The heart is unremarkable without pericardial effusion. Normal caliber of the thoracic aorta. Mediastinum/Nodes: Borderline enlarged mediastinal lymph nodes are likely reactive. Thyroid, trachea, and esophagus are unremarkable. Lungs/Pleura: Widespread multifocal bilateral airspace disease consistent with pneumonia given clinical presentation. Pattern is consistent with COVID-19 or other atypical infection. No effusion or pneumothorax. Central airways are patent. Upper Abdomen: Diffuse hepatic steatosis. No acute upper abdominal findings. Musculoskeletal: No acute or destructive bony lesions. Reconstructed images demonstrate no additional findings. Review of the MIP images confirms the above findings. IMPRESSION: 1. No evidence of pulmonary embolus. 2. Widespread multifocal bilateral airspace disease, with an appearance most consistent with COVID-19 pneumonia. 3. Reactive mediastinal adenopathy. 4. Diffuse hepatic steatosis. Electronically Signed   By: Sharlet Salina M.D.   On:  02/14/2020 17:43   DG Chest Port 1 View  Result Date: 02/14/2020 CLINICAL DATA:  Hypoxia and shortness of breath EXAM: PORTABLE CHEST 1 VIEW COMPARISON:  None. FINDINGS: There is airspace opacity throughout the lungs bilaterally without appreciable consolidation. Heart is upper normal in size with pulmonary vascularity normal. No adenopathy. No bone lesions. IMPRESSION: Widespread airspace opacity bilaterally. Suspect multifocal pneumonia, likely of atypical organism etiology. A degree of pulmonary edema superimposed cannot be excluded. Heart upper normal in size. No adenopathy evident. Electronically Signed   By: Bretta Bang III M.D.   On: 02/14/2020 12:19    Scheduled Meds: . azithromycin  500 mg Oral Q24H  . baricitinib  4 mg Oral Daily  . dexamethasone (DECADRON) injection  6 mg Intravenous Q24H  . enoxaparin (LOVENOX) injection  65 mg Subcutaneous Q24H  . Ipratropium-Albuterol  1 puff Inhalation Q6H  . sodium chloride flush  3 mL Intravenous Q12H   Continuous Infusions: . cefTRIAXone (ROCEPHIN)  IV 2 g (02/15/20 1431)  LOS: 1 day   Time spent: 35 minutes.  Tyrone Nineyan B Oksana Deberry, MD [facility] [email or web address] 02/15/2020, 4:19 PM

## 2020-02-16 ENCOUNTER — Other Ambulatory Visit: Payer: Self-pay

## 2020-02-16 ENCOUNTER — Inpatient Hospital Stay (HOSPITAL_COMMUNITY): Payer: BC Managed Care – PPO

## 2020-02-16 DIAGNOSIS — R7989 Other specified abnormal findings of blood chemistry: Secondary | ICD-10-CM

## 2020-02-16 DIAGNOSIS — J9601 Acute respiratory failure with hypoxia: Secondary | ICD-10-CM | POA: Diagnosis not present

## 2020-02-16 DIAGNOSIS — I160 Hypertensive urgency: Secondary | ICD-10-CM | POA: Diagnosis not present

## 2020-02-16 DIAGNOSIS — U071 COVID-19: Secondary | ICD-10-CM | POA: Diagnosis not present

## 2020-02-16 LAB — CBC WITH DIFFERENTIAL/PLATELET
Abs Immature Granulocytes: 0.1 10*3/uL — ABNORMAL HIGH (ref 0.00–0.07)
Basophils Absolute: 0 10*3/uL (ref 0.0–0.1)
Basophils Relative: 0 %
Eosinophils Absolute: 0 10*3/uL (ref 0.0–0.5)
Eosinophils Relative: 0 %
HCT: 44.8 % (ref 39.0–52.0)
Hemoglobin: 14 g/dL (ref 13.0–17.0)
Immature Granulocytes: 1 %
Lymphocytes Relative: 4 %
Lymphs Abs: 0.6 10*3/uL — ABNORMAL LOW (ref 0.7–4.0)
MCH: 27.8 pg (ref 26.0–34.0)
MCHC: 31.3 g/dL (ref 30.0–36.0)
MCV: 88.9 fL (ref 80.0–100.0)
Monocytes Absolute: 0.4 10*3/uL (ref 0.1–1.0)
Monocytes Relative: 3 %
Neutro Abs: 11.8 10*3/uL — ABNORMAL HIGH (ref 1.7–7.7)
Neutrophils Relative %: 92 %
Platelets: 247 10*3/uL (ref 150–400)
RBC: 5.04 MIL/uL (ref 4.22–5.81)
RDW: 14.2 % (ref 11.5–15.5)
WBC: 12.9 10*3/uL — ABNORMAL HIGH (ref 4.0–10.5)
nRBC: 0.3 % — ABNORMAL HIGH (ref 0.0–0.2)

## 2020-02-16 LAB — COMPREHENSIVE METABOLIC PANEL
ALT: 52 U/L — ABNORMAL HIGH (ref 0–44)
AST: 59 U/L — ABNORMAL HIGH (ref 15–41)
Albumin: 3.4 g/dL — ABNORMAL LOW (ref 3.5–5.0)
Alkaline Phosphatase: 60 U/L (ref 38–126)
Anion gap: 11 (ref 5–15)
BUN: 25 mg/dL — ABNORMAL HIGH (ref 6–20)
CO2: 25 mmol/L (ref 22–32)
Calcium: 8.8 mg/dL — ABNORMAL LOW (ref 8.9–10.3)
Chloride: 107 mmol/L (ref 98–111)
Creatinine, Ser: 0.8 mg/dL (ref 0.61–1.24)
GFR, Estimated: >60 mL/min (ref >60)
Glucose, Bld: 134 mg/dL — ABNORMAL HIGH (ref 70–99)
Potassium: 4.4 mmol/L (ref 3.5–5.1)
Sodium: 143 mmol/L (ref 135–145)
Total Bilirubin: 0.4 mg/dL (ref 0.3–1.2)
Total Protein: 8 g/dL (ref 6.5–8.1)

## 2020-02-16 LAB — D-DIMER, QUANTITATIVE: D-Dimer, Quant: 6.03 ug/mL-FEU — ABNORMAL HIGH (ref 0.00–0.50)

## 2020-02-16 LAB — C-REACTIVE PROTEIN: CRP: 2.9 mg/dL — ABNORMAL HIGH (ref <1.0)

## 2020-02-16 MED ORDER — IOHEXOL 350 MG/ML SOLN
100.0000 mL | Freq: Once | INTRAVENOUS | Status: AC | PRN
  Administered 2020-02-16: 100 mL via INTRAVENOUS

## 2020-02-16 MED ORDER — ENOXAPARIN SODIUM 150 MG/ML ~~LOC~~ SOLN
1.0000 mg/kg | Freq: Two times a day (BID) | SUBCUTANEOUS | Status: DC
  Administered 2020-02-16 – 2020-02-17 (×3): 135 mg via SUBCUTANEOUS
  Filled 2020-02-16 (×3): qty 0.9

## 2020-02-16 NOTE — Plan of Care (Signed)

## 2020-02-16 NOTE — Progress Notes (Signed)
PROGRESS NOTE  BROOKS KINNAN  HGD:[id number] DOB: Aug 07, 1985 DOA: 02/14/2020 PCP: Oneita Hurt, No   Brief Narrative: HAMILTON MARINELLO is a 34 y.o. male with a history of obesity who was evaluated at urgent care for 2 weeks of dyspnea and cough found to have room air SpO2 in 50%'s improved with NRB prompting transfer to ED on 02/14/2020. SARS-CoV-2 Ag and PCR were positive, inflammatory markers elevated (CRP 5.5, PCT 0.24), and CXR demonstrated bilateral airspace infiltrates. Ct revealed moderately severe diffuse GGOs and interstitial infiltrates without PE or lobar consolidation. He was admitted, started on remdesivir, steroids, baricitinib, and antibiotics. He's continued to require 15L HFNC and NRB with no worsening of respiratory effort.  Assessment & Plan: Principal Problem:   Acute hypoxemic respiratory failure due to COVID-19 [facility]) Active Problems:   Hypertensive urgency  Acute hypoxemic respiratory failure due to covid-19 pneumonia and possible superimposed CAP: SARS-CoV-2 PCR and Ag positive on 11/10, PCT elevated with air bronchograms on CT lung windows on my personal review.  - Continue remdesivir - Continue steroids. CRP down as anticipated.  - Continue baricitinib, started 11/10 - Continue empiric abx - Encourage OOB, IS, FV, and awake proning if able - Continue airborne, contact precautions for 21 days from positive testing. - Monitor CMP and inflammatory markers  LFT elevations:  - Improving  D-dimer elevation: In concert with rising oxygen requirements, though had negative CTA chest initially.  - Give empiric lovenox 1mg /kg q12h - Check LE venous U/S. Hoping to avoid repeat CTA chest. If negative study, will repeat CTA chest given this abrupt rise, his risk factors for VTE, and increasing severity of dyspnea today.  Hypertensive urgency: Improving.  - prn labetalol  Morbid obesity: Estimated body mass index is 48.1 kg/m as calculated from the following:   Height as of this  encounter: 5\' 6"  (1.676 m).   Weight as of this encounter: 135.2 kg.  DVT prophylaxis: Lovenox 1mg /kg q12h Code Status: Full Family Communication: None at bedside Disposition Plan:  Status is: Inpatient  Remains inpatient appropriate because:Inpatient level of care appropriate due to severity of illness  Dispo: The patient is from: Home              Anticipated d/c is to: Home              Anticipated d/c date is: > 3 days              Patient currently is not medically stable to d/c.  Consultants:   None  Procedures:   None  Antimicrobials:  Remdesivir, ceftriaxone, azithromycin   Subjective: Got up to bathroom and reports severe dyspnea at that time. No chest pain or leg swelling. His mother had blood clots in her leg around the age of 41.Shortness of breath at rest is moderate, stable, constant.   Objective: Vitals:   02/15/20 2031 02/16/20 0028 02/16/20 0307 02/16/20 0427  BP: 124/82 116/75  128/70  Pulse: 92 87 88 77  Resp: (!) 24 (!) 24 20 (!) 24  Temp: 98.2 F (36.8 C) 97.6 F (36.4 C)  98.3 F (36.8 C)  TempSrc: Oral Oral  Oral  SpO2: 92% 92% (!) 89% 91%  Weight:      Height:        Intake/Output Summary (Last 24 hours) at 02/16/2020 1100 Last data filed at 02/16/2020 0630 Gross per 24 hour  Intake 240 ml  Output 650 ml  Net -410 ml   Filed Weights   02/15/20 0005  Weight: 135.2 kg   Gen: 34 y.o. male in no distress Pulm: Nonlabored but tachypneic at rest, diminished with crackles. CV: Regular borderline tachycardia. No murmur, rub, or gallop. UTD JVD, no pitting/tender dependent edema. GI: Abdomen soft, non-tender, non-distended, with normoactive bowel sounds.  Ext: Warm, no deformities Skin: No rashes, lesions or ulcers on visualized skin. Neuro: Alert and oriented. No focal neurological deficits. Psych: Judgement and insight appear fair. Mood euthymic & affect congruent. Behavior is appropriate.    Data Reviewed: I have personally reviewed  following labs and imaging studies  CBC: Recent Labs  Lab 02/14/20 1141 02/15/20 0659 02/16/20 0444  WBC 8.6 10.6* 12.9*  NEUTROABS 7.3 9.0* 11.8*  HGB 13.9 13.9 14.0  HCT 45.0 44.2 44.8  MCV 89.1 88.6 88.9  PLT 179 193 247   Basic Metabolic Panel: Recent Labs  Lab 02/14/20 1141 02/15/20 0659 02/16/20 0444  NA 142 142 143  K 4.2 4.3 4.4  CL 104 106 107  CO2 25 27 25   GLUCOSE 118* 127* 134*  BUN 22* 22* 25*  CREATININE 1.14 0.90 0.80  CALCIUM 8.5* 8.4* 8.8*   GFR: Estimated Creatinine Clearance: 168.4 mL/min (by C-G formula based on SCr of 0.8 mg/dL). Liver Function Tests: Recent Labs  Lab 02/14/20 1141 02/15/20 0659 02/16/20 0444  AST 109* 75* 59*  ALT 66* 56* 52*  ALKPHOS 41 44 60  BILITOT 0.6 0.4 0.4  PROT 8.3* 8.1 8.0  ALBUMIN 3.8 3.6 3.4*   No results for input(s): LIPASE, AMYLASE in the last 168 hours. No results for input(s): AMMONIA in the last 168 hours. Coagulation Profile: No results for input(s): INR, PROTIME in the last 168 hours. Cardiac Enzymes: No results for input(s): CKTOTAL, CKMB, CKMBINDEX, TROPONINI in the last 168 hours. BNP (last 3 results) No results for input(s): PROBNP in the last 8760 hours. HbA1C: No results for input(s): HGBA1C in the last 72 hours. CBG: No results for input(s): GLUCAP in the last 168 hours. Lipid Profile: Recent Labs    02/14/20 1141  TRIG 151*   Thyroid Function Tests: No results for input(s): TSH, T4TOTAL, FREET4, T3FREE, THYROIDAB in the last 72 hours. Anemia Panel: Recent Labs    02/14/20 1141 02/15/20 0659  FERRITIN 755* 804*   Urine analysis:    Component Value Date/Time   COLORURINE YELLOW 06/01/2007 0606   APPEARANCEUR CLEAR 06/01/2007 0606   LABSPEC >1.030 (H) 06/01/2007 0606   PHURINE 6.0 06/01/2007 0606   GLUCOSEU NEGATIVE 06/01/2007 0606   HGBUR MODERATE (A) 06/01/2007 0606   BILIRUBINUR SMALL (A) 06/01/2007 0606   KETONESUR NEGATIVE 06/01/2007 0606   PROTEINUR NEGATIVE  06/01/2007 0606   UROBILINOGEN 0.2 06/01/2007 0606   NITRITE NEGATIVE 06/01/2007 0606   LEUKOCYTESUR NEGATIVE 06/01/2007 0606   Recent Results (from the past 240 hour(s))  Respiratory Panel by RT PCR (Flu A&B, Covid) - Nasopharyngeal Swab     Status: Abnormal   Collection Time: 02/14/20 11:41 AM   Specimen: Nasopharyngeal Swab  Result Value Ref Range Status   SARS Coronavirus 2 by RT PCR POSITIVE (A) NEGATIVE Final    Comment: RESULT CALLED TO, READ BACK BY AND VERIFIED WITH: WEST,S. RN @1335  02/14/20 BILLINGSLEY,L (NOTE) SARS-CoV-2 target nucleic acids are DETECTED.  SARS-CoV-2 RNA is generally detectable in upper respiratory specimens  during the acute phase of infection. Positive results are indicative of the presence of the identified virus, but do not rule out bacterial infection or co-infection with other pathogens not detected by the test. Clinical  correlation with patient history and other diagnostic information is necessary to determine patient infection status. The expected result is Negative.  Fact Sheet for Patients:  https://www.moore.com/  Fact Sheet for Healthcare Providers: https://www.young.biz/  This test is not yet approved or cleared by the [place] FDA and  has been authorized for detection and/or diagnosis of SARS-CoV-2 by FDA under an Emergency Use Authorization (EUA).  This EUA will remain in effect (meaning this test ca n be used) for the duration of  the COVID-19 declaration under Section 564(b)(1) of the Act, 21 U.S.C. section 360bbb-3(b)(1), unless the authorization is terminated or revoked sooner.      Influenza A by PCR NEGATIVE NEGATIVE Final   Influenza B by PCR NEGATIVE NEGATIVE Final    Comment: (NOTE) The Xpert Xpress SARS-CoV-2/FLU/RSV assay is intended as an aid in  the diagnosis of influenza from Nasopharyngeal swab specimens and  should not be used as a sole basis for treatment. Nasal  washings and  aspirates are unacceptable for Xpert Xpress SARS-CoV-2/FLU/RSV  testing.  Fact Sheet for Patients: https://www.moore.com/  Fact Sheet for Healthcare Providers: https://www.young.biz/  This test is not yet approved or cleared by the [place] FDA and  has been authorized for detection and/or diagnosis of SARS-CoV-2 by  FDA under an Emergency Use Authorization (EUA). This EUA will remain  in effect (meaning this test can be used) for the duration of the  Covid-19 declaration under Section 564(b)(1) of the Act, 21  U.S.C. section 360bbb-3(b)(1), unless the authorization is  terminated or revoked. Performed at [facility], [address], [place], [place] [address]       Radiology Studies: CT ANGIO CHEST PE W OR WO CONTRAST  Result Date: 02/14/2020 CLINICAL DATA:  Shortness of breath for 2 weeks, low-grade fever, productive cough EXAM: CT ANGIOGRAPHY CHEST WITH CONTRAST TECHNIQUE: Multidetector CT imaging of the chest was performed using the standard protocol during bolus administration of intravenous contrast. Multiplanar CT image reconstructions and MIPs were obtained to evaluate the vascular anatomy. CONTRAST:  OMNIPAQUE IOHEXOL 350 MG/ML SOLN COMPARISON:  02/14/2020 FINDINGS: Cardiovascular: This is a technically adequate evaluation of the pulmonary vasculature. There are no filling defects or pulmonary emboli. The heart is unremarkable without pericardial effusion. Normal caliber of the thoracic aorta. Mediastinum/Nodes: Borderline enlarged mediastinal lymph nodes are likely reactive. Thyroid, trachea, and esophagus are unremarkable. Lungs/Pleura: Widespread multifocal bilateral airspace disease consistent with pneumonia given clinical presentation. Pattern is consistent with COVID-19 or other atypical infection. No effusion or pneumothorax. Central airways are patent. Upper Abdomen: Diffuse hepatic  steatosis. No acute upper abdominal findings. Musculoskeletal: No acute or destructive bony lesions. Reconstructed images demonstrate no additional findings. Review of the MIP images confirms the above findings. IMPRESSION: 1. No evidence of pulmonary embolus. 2. Widespread multifocal bilateral airspace disease, with an appearance most consistent with COVID-19 pneumonia. 3. Reactive mediastinal adenopathy. 4. Diffuse hepatic steatosis. Electronically Signed   By: Sharlet Salina M.D.   On: 02/14/2020 17:43   DG Chest Port 1 View  Result Date: 02/14/2020 CLINICAL DATA:  Hypoxia and shortness of breath EXAM: PORTABLE CHEST 1 VIEW COMPARISON:  None. FINDINGS: There is airspace opacity throughout the lungs bilaterally without appreciable consolidation. Heart is upper normal in size with pulmonary vascularity normal. No adenopathy. No bone lesions. IMPRESSION: Widespread airspace opacity bilaterally. Suspect multifocal pneumonia, likely of atypical organism etiology. A degree of pulmonary edema superimposed cannot be excluded. Heart upper normal in size. No adenopathy evident. Electronically Signed  By: Bretta BangWilliam  Woodruff III M.D.   On: 02/14/2020 12:19   VAS US LOWER EXTREMITY VENOUS (DVT)  Result Date: 02/16/2020  Lower Venous DVT Study Indications: Elevated Ddimer.  Risk Factors: COVID 19 positive. Limitations: Body habitus, poor ultrasound/tissue interface and patient positioning. Comparison Study: No prior studies. Performing Technologist: Chanda BusingGregory Collins RVT  Examination Guidelines: A complete evaluation includes B-mode imaging, spectral Doppler, color Doppler, and power Doppler as needed of all accessible portions of each vessel. Bilateral testing is considered an integral part of a complete examination. Limited examinations for reoccurring indications may be performed as noted. The reflux portion of the exam is performed with the patient in reverse Trendelenburg.   +---------+---------------+---------+-----------+----------+--------------+ RIGHT    CompressibilityPhasicitySpontaneityPropertiesThrombus Aging +---------+---------------+---------+-----------+----------+--------------+ CFV      Full           Yes      Yes                                 +---------+---------------+---------+-----------+----------+--------------+ SFJ      Full                                                        +---------+---------------+---------+-----------+----------+--------------+ FV Prox  Full                                                        +---------+---------------+---------+-----------+----------+--------------+ FV Mid   Full                                                        +---------+---------------+---------+-----------+----------+--------------+ FV DistalFull                                                        +---------+---------------+---------+-----------+----------+--------------+ PFV      Full                                                        +---------+---------------+---------+-----------+----------+--------------+ POP      Full           Yes      Yes                                 +---------+---------------+---------+-----------+----------+--------------+ PTV      Full                                                        +---------+---------------+---------+-----------+----------+--------------+  PERO     Full                                                        +---------+---------------+---------+-----------+----------+--------------+   +---------+---------------+---------+-----------+----------+-------------------+ LEFT     CompressibilityPhasicitySpontaneityPropertiesThrombus Aging      +---------+---------------+---------+-----------+----------+-------------------+ CFV      Full           Yes      Yes                                       +---------+---------------+---------+-----------+----------+-------------------+ SFJ      Full                                                             +---------+---------------+---------+-----------+----------+-------------------+ FV Prox  Full                                                             +---------+---------------+---------+-----------+----------+-------------------+ FV Mid   Full                                                             +---------+---------------+---------+-----------+----------+-------------------+ FV Distal               Yes      Yes                                      +---------+---------------+---------+-----------+----------+-------------------+ PFV      Full                                                             +---------+---------------+---------+-----------+----------+-------------------+ POP      Full           Yes      Yes                                      +---------+---------------+---------+-----------+----------+-------------------+ PTV      Full                                                             +---------+---------------+---------+-----------+----------+-------------------+ PERO  Not well visualized +---------+---------------+---------+-----------+----------+-------------------+     Summary: RIGHT: - There is no evidence of deep vein thrombosis in the lower extremity. However, portions of this examination were limited- see technologist comments above.  - No cystic structure found in the popliteal fossa.  LEFT: - There is no evidence of deep vein thrombosis in the lower extremity. However, portions of this examination were limited- see technologist comments above.  - No cystic structure found in the popliteal fossa.  *See table(s) above for measurements and observations.    Preliminary     Scheduled Meds: . azithromycin  500 mg Oral Q24H   . baricitinib  4 mg Oral Daily  . enoxaparin (LOVENOX) injection  1 mg/kg Subcutaneous Q12H  . Ipratropium-Albuterol  1 puff Inhalation Q6H  . methylPREDNISolone (SOLU-MEDROL) injection  125 mg Intravenous BID  . sodium chloride flush  3 mL Intravenous Q12H   Continuous Infusions: . cefTRIAXone (ROCEPHIN)  IV 2 g (02/15/20 1431)     LOS: 2 days   Time spent: 35 minutes.  Tyrone Nine, MD [facility] [email or web address] 02/16/2020, 11:00 AM

## 2020-02-16 NOTE — Progress Notes (Signed)
Bilateral lower extremity venous duplex has been completed. Preliminary results can be found in CV Proc through chart review.   02/16/20 10:00 AM Olen Cordial RVT

## 2020-02-17 DIAGNOSIS — I160 Hypertensive urgency: Secondary | ICD-10-CM | POA: Diagnosis not present

## 2020-02-17 DIAGNOSIS — U071 COVID-19: Secondary | ICD-10-CM | POA: Diagnosis not present

## 2020-02-17 DIAGNOSIS — J9601 Acute respiratory failure with hypoxia: Secondary | ICD-10-CM | POA: Diagnosis not present

## 2020-02-17 LAB — CBC WITH DIFFERENTIAL/PLATELET
Abs Immature Granulocytes: 0.14 10*3/uL — ABNORMAL HIGH (ref 0.00–0.07)
Basophils Absolute: 0 10*3/uL (ref 0.0–0.1)
Basophils Relative: 0 %
Eosinophils Absolute: 0 10*3/uL (ref 0.0–0.5)
Eosinophils Relative: 0 %
HCT: 45.6 % (ref 39.0–52.0)
Hemoglobin: 13.9 g/dL (ref 13.0–17.0)
Immature Granulocytes: 1 %
Lymphocytes Relative: 7 %
Lymphs Abs: 1 10*3/uL (ref 0.7–4.0)
MCH: 27.9 pg (ref 26.0–34.0)
MCHC: 30.5 g/dL (ref 30.0–36.0)
MCV: 91.6 fL (ref 80.0–100.0)
Monocytes Absolute: 0.8 10*3/uL (ref 0.1–1.0)
Monocytes Relative: 5 %
Neutro Abs: 12.4 10*3/uL — ABNORMAL HIGH (ref 1.7–7.7)
Neutrophils Relative %: 87 %
Platelets: 275 10*3/uL (ref 150–400)
RBC: 4.98 MIL/uL (ref 4.22–5.81)
RDW: 14.2 % (ref 11.5–15.5)
WBC: 14.4 10*3/uL — ABNORMAL HIGH (ref 4.0–10.5)
nRBC: 0.3 % — ABNORMAL HIGH (ref 0.0–0.2)

## 2020-02-17 LAB — COMPREHENSIVE METABOLIC PANEL
ALT: 54 U/L — ABNORMAL HIGH (ref 0–44)
AST: 56 U/L — ABNORMAL HIGH (ref 15–41)
Albumin: 3.5 g/dL (ref 3.5–5.0)
Alkaline Phosphatase: 69 U/L (ref 38–126)
Anion gap: 10 (ref 5–15)
BUN: 27 mg/dL — ABNORMAL HIGH (ref 6–20)
CO2: 26 mmol/L (ref 22–32)
Calcium: 8.7 mg/dL — ABNORMAL LOW (ref 8.9–10.3)
Chloride: 106 mmol/L (ref 98–111)
Creatinine, Ser: 0.89 mg/dL (ref 0.61–1.24)
GFR, Estimated: >60 mL/min (ref >60)
Glucose, Bld: 129 mg/dL — ABNORMAL HIGH (ref 70–99)
Potassium: 4.7 mmol/L (ref 3.5–5.1)
Sodium: 142 mmol/L (ref 135–145)
Total Bilirubin: 0.6 mg/dL (ref 0.3–1.2)
Total Protein: 8 g/dL (ref 6.5–8.1)

## 2020-02-17 LAB — D-DIMER, QUANTITATIVE: D-Dimer, Quant: 5.4 ug/mL-FEU — ABNORMAL HIGH (ref 0.00–0.50)

## 2020-02-17 LAB — GLUCOSE, CAPILLARY: Glucose-Capillary: 112 mg/dL — ABNORMAL HIGH (ref 70–99)

## 2020-02-17 LAB — C-REACTIVE PROTEIN: CRP: 1 mg/dL — ABNORMAL HIGH (ref <1.0)

## 2020-02-17 MED ORDER — ENOXAPARIN SODIUM 80 MG/0.8ML ~~LOC~~ SOLN
65.0000 mg | Freq: Two times a day (BID) | SUBCUTANEOUS | Status: DC
  Administered 2020-02-17 – 2020-02-25 (×16): 65 mg via SUBCUTANEOUS
  Filled 2020-02-17 (×16): qty 0.8

## 2020-02-17 NOTE — Progress Notes (Signed)
PROGRESS NOTE  Costella HatcherRishawn L Verbrugge  ZOX:[id number] DOB: 02/12/1986 DOA: 02/14/2020 PCP: Oneita HurtPcp, No   Brief Narrative: Costella HatcherRishawn L Musquiz is a 34 y.o. male with a history of obesity who was evaluated at urgent care for 2 weeks of dyspnea and cough found to have room air SpO2 in 50%'s improved with NRB prompting transfer to ED on 02/14/2020. SARS-CoV-2 Ag and PCR were positive, inflammatory markers elevated (CRP 5.5, PCT 0.24), and CXR demonstrated bilateral airspace infiltrates. Ct revealed moderately severe diffuse GGOs and interstitial infiltrates without PE or lobar consolidation. He was admitted, started on remdesivir, steroids, baricitinib, and antibiotics. He's continued to require 15L HFNC and NRB with no worsening of respiratory effort.  Assessment & Plan: Principal Problem:   Acute hypoxemic respiratory failure due to COVID-19 [facility](HCC) Active Problems:   Hypertensive urgency  Acute hypoxemic respiratory failure due to covid-19 pneumonia and possible superimposed CAP: SARS-CoV-2 PCR and Ag positive on 11/10, PCT elevated with air bronchograms on CT lung windows on my personal review.  - Continue remdesivir - Continue steroids. CRP down as anticipated.  - Continue baricitinib, started 11/10 - Continue empiric abx with bibasilar consolidation. - Encourage OOB, and incentive spirometry mainly with continued bibasilar compression on CT. - Continue airborne, contact precautions for 21 days from positive testing. - Monitor CMP and inflammatory markers  LFT elevations:  - Improving  D-dimer elevation: In concert with rising oxygen requirements, though had negative CTA chest initially. LE venous U/S without DVT and CTA chest repeated 11/12 and again negative for PE on my personal review which confirms continued widespread GGOs. D-dimer down slightly, still very elevated. - Continue intermediate lovenox dosing (0.5mg /kg q12h)  Hypertensive urgency: Improving. Normotensive. - prn labetalol  Morbid  obesity: Estimated body mass index is 48.1 kg/m as calculated from the following:   Height as of this encounter: 5\' 6"  (1.676 m).   Weight as of this encounter: 135.2 kg.  DVT prophylaxis: Lovenox 0.5mg /kg q12h Code Status: Full Family Communication: None at bedside. Wife by phone today  Disposition Plan:  Status is: Inpatient  Remains inpatient appropriate because:Inpatient level of care appropriate due to severity of illness  Dispo: The patient is from: Home              Anticipated d/c is to: Home              Anticipated d/c date is: > 3 days              Patient currently is not medically stable to d/c.  Consultants:   None  Procedures:   None  Antimicrobials:  Remdesivir, ceftriaxone, azithromycin   Subjective: Shortness of breath moderate-severe when getting to BSC, constant, improves over several minutes with O2 and resting, no chest pain or leg swelling/pain.   Objective: Vitals:   02/16/20 2115 02/17/20 0533 02/17/20 0800 02/17/20 0856  BP: 123/79 135/79    Pulse: 92 95    Resp: (!) 25 (!) 25    Temp: 98.3 F (36.8 C) 98.2 F (36.8 C)    TempSrc: Oral Oral    SpO2: 90% 90% 92% (!) 84%  Weight:      Height:        Intake/Output Summary (Last 24 hours) at 02/17/2020 1050 Last data filed at 02/17/2020 0300 Gross per 24 hour  Intake 360 ml  Output 800 ml  Net -440 ml   Filed Weights   02/15/20 0005  Weight: 135.2 kg   Gen: Obese, pleasant male in  no distress Pulm: Nonlabored breathing HFNC + NRB at rest, more tachypneic/labored with longer sentences, crackles stable. CV: Regular rate and rhythm. No murmur, rub, or gallop. No JVD, no dependent edema. GI: Abdomen soft, non-tender, non-distended, with normoactive bowel sounds.  Ext: Warm, no deformities Skin: No rashes, lesions or ulcers on visualized skin. Neuro: Alert and oriented. No focal neurological deficits. Psych: Judgement and insight appear fair. Mood euthymic & affect congruent. Behavior  is appropriate.    Data Reviewed: I have personally reviewed following labs and imaging studies  CBC: Recent Labs  Lab 02/14/20 1141 02/15/20 0659 02/16/20 0444 02/17/20 0537  WBC 8.6 10.6* 12.9* 14.4*  NEUTROABS 7.3 9.0* 11.8* 12.4*  HGB 13.9 13.9 14.0 13.9  HCT 45.0 44.2 44.8 45.6  MCV 89.1 88.6 88.9 91.6  PLT 179 193 247 275   Basic Metabolic Panel: Recent Labs  Lab 02/14/20 1141 02/15/20 0659 02/16/20 0444 02/17/20 0537  NA 142 142 143 142  K 4.2 4.3 4.4 4.7  CL 104 106 107 106  CO2 GLUCOSE 118* 127* 134* 129*  BUN 22* 22* 25* 27*  CREATININE 1.14 0.90 0.80 0.89  CALCIUM 8.5* 8.4* 8.8* 8.7*   GFR: Estimated Creatinine Clearance: 151.4 mL/min (by C-G formula based on SCr of 0.89 mg/dL). Liver Function Tests: Recent Labs  Lab 02/14/20 1141 02/15/20 0659 02/16/20 0444 02/17/20 0537  AST 109* 75* 59* 56*  ALT 66* 56* 52* 54*  ALKPHOS 41 44 60 69  BILITOT 0.6 0.4 0.4 0.6  PROT 8.3* 8.1 8.0 8.0  ALBUMIN 3.8 3.6 3.4* 3.5   Lipid Profile: Recent Labs    02/14/20 1141  TRIG 151*   Thyroid Function Tests: No results for input(s): TSH, T4TOTAL, FREET4, T3FREE, THYROIDAB in the last 72 hours. Anemia Panel: Recent Labs    02/14/20 1141 02/15/20 0659  FERRITIN 755* 804*   Urine analysis:    Component Value Date/Time   COLORURINE YELLOW 06/01/2007 0606   APPEARANCEUR CLEAR 06/01/2007 0606   LABSPEC >1.030 (H) 06/01/2007 0606   PHURINE 6.0 06/01/2007 0606   GLUCOSEU NEGATIVE 06/01/2007 0606   HGBUR MODERATE (A) 06/01/2007 0606   BILIRUBINUR SMALL (A) 06/01/2007 0606   KETONESUR NEGATIVE 06/01/2007 0606   PROTEINUR NEGATIVE 06/01/2007 0606   UROBILINOGEN 0.2 06/01/2007 0606   NITRITE NEGATIVE 06/01/2007 0606   LEUKOCYTESUR NEGATIVE 06/01/2007 0606   Recent Results (from the past 240 hour(s))  Respiratory Panel by RT PCR (Flu A&B, Covid) - Nasopharyngeal Swab     Status: Abnormal   Collection Time: 02/14/20 11:41 AM   Specimen:  Nasopharyngeal Swab  Result Value Ref Range Status   SARS Coronavirus 2 by RT PCR POSITIVE (A) NEGATIVE Final    Comment: RESULT CALLED TO, READ BACK BY AND VERIFIED WITH: WEST,S. RN  02/14/20 BILLINGSLEY,L (NOTE) SARS-CoV-2 target nucleic acids are DETECTED.  SARS-CoV-2 RNA is generally detectable in upper respiratory specimens  during the acute phase of infection. Positive results are indicative of the presence of the identified virus, but do not rule out bacterial infection or co-infection with other pathogens not detected by the test. Clinical correlation with patient history and other diagnostic information is necessary to determine patient infection status. The expected result is Negative.  Fact Sheet for Patients:  https://www.moore.com/  Fact Sheet for Healthcare Providers: https://www.young.biz/  This test is not yet approved or cleared by the [place] FDA and  has been authorized for detection and/or diagnosis of SARS-CoV-2 by FDA under an  Emergency Use Authorization (EUA).  This EUA will remain in effect (meaning this test ca n be used) for the duration of  the COVID-19 declaration under Section 564(b)(1) of the Act, 21 U.S.C. section 360bbb-3(b)(1), unless the authorization is terminated or revoked sooner.      Influenza A by PCR NEGATIVE NEGATIVE Final   Influenza B by PCR NEGATIVE NEGATIVE Final    Comment: (NOTE) The Xpert Xpress SARS-CoV-2/FLU/RSV assay is intended as an aid in  the diagnosis of influenza from Nasopharyngeal swab specimens and  should not be used as a sole basis for treatment. Nasal washings and  aspirates are unacceptable for Xpert Xpress SARS-CoV-2/FLU/RSV  testing.  Fact Sheet for Patients: https://www.moore.com/  Fact Sheet for Healthcare Providers: https://www.young.biz/  This test is not yet approved or cleared by the [place] FDA and  has  been authorized for detection and/or diagnosis of SARS-CoV-2 by  FDA under an Emergency Use Authorization (EUA). This EUA will remain  in effect (meaning this test can be used) for the duration of the  Covid-19 declaration under Section 564(b)(1) of the Act, 21  U.S.C. section 360bbb-3(b)(1), unless the authorization is  terminated or revoked. Performed at [facility], [address], [place], [place] [address]       Radiology Studies: CT ANGIO CHEST PE W OR WO CONTRAST  Result Date: 02/16/2020 CLINICAL DATA:  34 year old male with positive D-dimer. Concern for pulmonary embolism. Positive COVID-19. EXAM: CT ANGIOGRAPHY CHEST WITH CONTRAST TECHNIQUE: Multidetector CT imaging of the chest was performed using the standard protocol during bolus administration of intravenous contrast. Multiplanar CT image reconstructions and MIPs were obtained to evaluate the vascular anatomy. CONTRAST:  OMNIPAQUE IOHEXOL 350 MG/ML SOLN COMPARISON:  Chest CT dated 02/14/2020. FINDINGS: Evaluation of this exam is limited due to respiratory motion artifact. Cardiovascular: There is no cardiomegaly or pericardial effusion. The thoracic aorta is unremarkable. The origins of the great vessels of the aortic arch appear patent as visualized. Evaluation of the pulmonary arteries is very limited due to severe respiratory motion artifact. No definite large central pulmonary artery embolus identified. Mediastinum/Nodes: No obvious hilar adenopathy. Top-normal subcarinal lymph nodes, likely reactive. The esophagus and the thyroid gland are grossly unremarkable. No mediastinal fluid collection. Lungs/Pleura: Diffuse bilateral pulmonary opacities consistent with multifocal pneumonia and in keeping with COVID-19. Small bilateral upper lobe pulmonary lacerations (26 and 30/6). There is no pleural effusion pneumothorax. The central airways are patent. Upper Abdomen: Fatty liver. Musculoskeletal: Degenerative  changes of the spine. No acute osseous pathology. Review of the MIP images confirms the above findings. IMPRESSION: 1. No CT evidence of central pulmonary artery embolus. 2. Multifocal pneumonia and in keeping with COVID-19. 3. Small bilateral upper lobe pulmonary lacerations. No pneumothorax. 4. Fatty liver. Electronically Signed   By: Elgie Collard M.D.   On: 02/16/2020 18:25   VAS [place] LOWER EXTREMITY VENOUS (DVT)  Result Date: 02/16/2020  Lower Venous DVT Study Indications: Elevated Ddimer.  Risk Factors: COVID 19 positive. Limitations: Body habitus, poor ultrasound/tissue interface and patient positioning. Comparison Study: No prior studies. Performing Technologist: Chanda Busing RVT  Examination Guidelines: A complete evaluation includes B-mode imaging, spectral Doppler, color Doppler, and power Doppler as needed of all accessible portions of each vessel. Bilateral testing is considered an integral part of a complete examination. Limited examinations for reoccurring indications may be performed as noted. The reflux portion of the exam is performed with the patient in reverse Trendelenburg.  +---------+---------------+---------+-----------+----------+--------------+ RIGHT  CompressibilityPhasicitySpontaneityPropertiesThrombus Aging +---------+---------------+---------+-----------+----------+--------------+ CFV      Full           Yes      Yes                                 +---------+---------------+---------+-----------+----------+--------------+ SFJ      Full                                                        +---------+---------------+---------+-----------+----------+--------------+ FV Prox  Full                                                        +---------+---------------+---------+-----------+----------+--------------+ FV Mid   Full                                                         +---------+---------------+---------+-----------+----------+--------------+ FV DistalFull                                                        +---------+---------------+---------+-----------+----------+--------------+ PFV      Full                                                        +---------+---------------+---------+-----------+----------+--------------+ POP      Full           Yes      Yes                                 +---------+---------------+---------+-----------+----------+--------------+ PTV      Full                                                        +---------+---------------+---------+-----------+----------+--------------+ PERO     Full                                                        +---------+---------------+---------+-----------+----------+--------------+   +---------+---------------+---------+-----------+----------+-------------------+ LEFT     CompressibilityPhasicitySpontaneityPropertiesThrombus Aging      +---------+---------------+---------+-----------+----------+-------------------+ CFV      Full           Yes      Yes                                      +---------+---------------+---------+-----------+----------+-------------------+  SFJ      Full                                                             +---------+---------------+---------+-----------+----------+-------------------+ FV Prox  Full                                                             +---------+---------------+---------+-----------+----------+-------------------+ FV Mid   Full                                                             +---------+---------------+---------+-----------+----------+-------------------+ FV Distal               Yes      Yes                                      +---------+---------------+---------+-----------+----------+-------------------+ PFV      Full                                                              +---------+---------------+---------+-----------+----------+-------------------+ POP      Full           Yes      Yes                                      +---------+---------------+---------+-----------+----------+-------------------+ PTV      Full                                                             +---------+---------------+---------+-----------+----------+-------------------+ PERO                                                  Not well visualized +---------+---------------+---------+-----------+----------+-------------------+     Summary: RIGHT: - There is no evidence of deep vein thrombosis in the lower extremity. However, portions of this examination were limited- see technologist comments above.  - No cystic structure found in the popliteal fossa.  LEFT: - There is no evidence of deep vein thrombosis in the lower extremity. However, portions of this examination were limited- see technologist comments above.  - No cystic structure found in the popliteal fossa.  *See table(s) above for measurements and observations.  Preliminary     Scheduled Meds: . azithromycin  500 mg Oral Q24H  . baricitinib  4 mg Oral Daily  . enoxaparin (LOVENOX) injection  1 mg/kg Subcutaneous Q12H  . Ipratropium-Albuterol  1 puff Inhalation Q6H  . methylPREDNISolone (SOLU-MEDROL) injection  125 mg Intravenous BID  . sodium chloride flush  3 mL Intravenous Q12H   Continuous Infusions: . cefTRIAXone (ROCEPHIN)  IV 2 g (02/16/20 1633)     LOS: 3 days   Time spent: 35 minutes.  Tyrone Nine, MD [facility] [email or web address] 02/17/2020, 10:50 AM

## 2020-02-18 DIAGNOSIS — U071 COVID-19: Secondary | ICD-10-CM | POA: Diagnosis not present

## 2020-02-18 DIAGNOSIS — J9601 Acute respiratory failure with hypoxia: Secondary | ICD-10-CM | POA: Diagnosis not present

## 2020-02-18 DIAGNOSIS — I160 Hypertensive urgency: Secondary | ICD-10-CM | POA: Diagnosis not present

## 2020-02-18 LAB — C-REACTIVE PROTEIN: CRP: 0.6 mg/dL (ref <1.0)

## 2020-02-18 LAB — CBC WITH DIFFERENTIAL/PLATELET
Abs Immature Granulocytes: 0.37 10*3/uL — ABNORMAL HIGH (ref 0.00–0.07)
Basophils Absolute: 0.1 10*3/uL (ref 0.0–0.1)
Basophils Relative: 0 %
Eosinophils Absolute: 0 10*3/uL (ref 0.0–0.5)
Eosinophils Relative: 0 %
HCT: 46.3 % (ref 39.0–52.0)
Hemoglobin: 14.4 g/dL (ref 13.0–17.0)
Immature Granulocytes: 2 %
Lymphocytes Relative: 5 %
Lymphs Abs: 0.9 10*3/uL (ref 0.7–4.0)
MCH: 27.7 pg (ref 26.0–34.0)
MCHC: 31.1 g/dL (ref 30.0–36.0)
MCV: 89 fL (ref 80.0–100.0)
Monocytes Absolute: 0.9 10*3/uL (ref 0.1–1.0)
Monocytes Relative: 5 %
Neutro Abs: 15.4 10*3/uL — ABNORMAL HIGH (ref 1.7–7.7)
Neutrophils Relative %: 88 %
Platelets: 305 10*3/uL (ref 150–400)
RBC: 5.2 MIL/uL (ref 4.22–5.81)
RDW: 13.9 % (ref 11.5–15.5)
WBC: 17.6 10*3/uL — ABNORMAL HIGH (ref 4.0–10.5)
nRBC: 0.1 % (ref 0.0–0.2)

## 2020-02-18 LAB — COMPREHENSIVE METABOLIC PANEL
ALT: 70 U/L — ABNORMAL HIGH (ref 0–44)
AST: 67 U/L — ABNORMAL HIGH (ref 15–41)
Albumin: 3.6 g/dL (ref 3.5–5.0)
Alkaline Phosphatase: 77 U/L (ref 38–126)
Anion gap: 13 (ref 5–15)
BUN: 26 mg/dL — ABNORMAL HIGH (ref 6–20)
CO2: 23 mmol/L (ref 22–32)
Calcium: 8.5 mg/dL — ABNORMAL LOW (ref 8.9–10.3)
Chloride: 106 mmol/L (ref 98–111)
Creatinine, Ser: 0.86 mg/dL (ref 0.61–1.24)
GFR, Estimated: >60 mL/min (ref >60)
Glucose, Bld: 116 mg/dL — ABNORMAL HIGH (ref 70–99)
Potassium: 4.9 mmol/L (ref 3.5–5.1)
Sodium: 142 mmol/L (ref 135–145)
Total Bilirubin: 0.7 mg/dL (ref 0.3–1.2)
Total Protein: 7.9 g/dL (ref 6.5–8.1)

## 2020-02-18 LAB — D-DIMER, QUANTITATIVE: D-Dimer, Quant: 7.33 ug/mL-FEU — ABNORMAL HIGH (ref 0.00–0.50)

## 2020-02-18 MED ORDER — IPRATROPIUM-ALBUTEROL 20-100 MCG/ACT IN AERS: 1.0000 | INHALATION_SPRAY | Freq: Four times a day (QID) | RESPIRATORY_TRACT | Status: DC | PRN

## 2020-02-18 MED ORDER — TRAZODONE HCL 50 MG PO TABS
50.0000 mg | ORAL_TABLET | Freq: Every evening | ORAL | Status: DC | PRN
  Administered 2020-02-18 – 2020-02-19 (×2): 50 mg via ORAL
  Filled 2020-02-18 (×2): qty 1

## 2020-02-18 MED ORDER — SODIUM CHLORIDE 0.9 % IV SOLN: 200.0000 mg | Freq: Once | INTRAVENOUS | Status: DC

## 2020-02-18 MED ORDER — SODIUM CHLORIDE 0.9 % IV SOLN: 100.0000 mg | Freq: Every day | INTRAVENOUS | Status: DC

## 2020-02-18 MED ORDER — SODIUM CHLORIDE 0.9 % IV SOLN
100.0000 mg | Freq: Every day | INTRAVENOUS | Status: AC
  Administered 2020-02-18 – 2020-02-21 (×4): 100 mg via INTRAVENOUS
  Filled 2020-02-18 (×4): qty 20

## 2020-02-18 MED ORDER — SODIUM CHLORIDE 0.9 % IV SOLN: 100.0000 mg | Freq: Once | INTRAVENOUS | Status: DC

## 2020-02-18 NOTE — Progress Notes (Signed)
PROGRESS NOTE  Isaac Cochran  GBT:[id number] DOB: 17-Jun-1985 DOA: 02/14/2020 PCP: Oneita Hurt, No   Brief Narrative: Isaac Cochran is a 34 y.o. male with a history of obesity who was evaluated at urgent care for 2 weeks of dyspnea and cough found to have room air SpO2 in 50%'s improved with NRB prompting transfer to ED on 02/14/2020. SARS-CoV-2 Ag and PCR were positive, inflammatory markers elevated (CRP 5.5, PCT 0.24), and CXR demonstrated bilateral airspace infiltrates. Ct revealed moderately severe diffuse GGOs and interstitial infiltrates without PE or lobar consolidation. He was admitted, started on remdesivir, steroids, baricitinib, and antibiotics. He's continued to require 15L HFNC and NRB with no worsening of respiratory effort.  Assessment & Plan: Principal Problem:   Acute hypoxemic respiratory failure due to COVID-19 [facility]) Active Problems:   Hypertensive urgency  Acute hypoxemic respiratory failure due to covid-19 pneumonia and superimposed CAP: SARS-CoV-2 PCR and Ag positive on 11/10. - Continues to require high level of oxygen supplementation. - Continue remdesivir, fell off orders. This is reordered to complete 5 total doses.  - Continue steroids. CRP continues to trend down, though d-dimer increased. Has had negative LE U/S and no PE on 2 different CTA chest. Will continue augmented lovenox dosing. - Continue baricitinib, started 11/10 - Continue empiric abx (11/10 - 11/14) with bibasilar consolidation. - Encourage OOB, and incentive spirometry mainly with continued bibasilar compression on CT. - Continue airborne, contact precautions for 21 days from positive testing. - Monitor CMP and inflammatory markers  LFT elevations:  - Improving  D-dimer elevation: In concert with rising oxygen requirements, though had negative CTA chest initially. LE venous U/S without DVT and CTA chest repeated 11/12 and again negative for PE on my personal review which confirms continued widespread  GGOs. D-dimer down slightly, still very elevated. - Continue intermediate lovenox dosing (0.5mg /kg q12h)  Hypertensive urgency: Improving. Normotensive. - prn labetalol  Insomnia:  - Trazodone qHS prn.  Morbid obesity: Estimated body mass index is 48.1 kg/m as calculated from the following:   Height as of this encounter: 5\' 6"  (1.676 m).   Weight as of this encounter: 135.2 kg.  DVT prophylaxis: Lovenox 0.5mg /kg q12h Code Status: Full Family Communication: None at bedside. Wife by phone today  Disposition Plan:  Status is: Inpatient  Remains inpatient appropriate because:Inpatient level of care appropriate due to severity of illness  Dispo: The patient is from: Home              Anticipated d/c is to: Home              Anticipated d/c date is: > 3 days              Patient currently is not medically stable to d/c.  Consultants:   None  Procedures:   None  Antimicrobials:  Remdesivir, ceftriaxone, azithromycin   Subjective: Shortness of breath stable, constant, moderate, worse with exertion and coughing. Not sleeping well due to dyspnea and interruptions. No chest pain or leg swelling/tenderness.   Objective: Vitals:   02/17/20 2010 02/18/20 0213 02/18/20 0619 02/18/20 0830  BP: 140/77  (!) 143/95   Pulse: 89 77 95 67  Resp: (!) 24 (!) 28 (!) 21 (!) 22  Temp: 98.3 F (36.8 C)  98.5 F (36.9 C)   TempSrc: Oral  Oral   SpO2: 92% 90% 93% 92%  Weight:      Height:        Intake/Output Summary (Last 24 hours) at 02/18/2020 1514  Last data filed at 02/18/2020 0905 Gross per 24 hour  Intake 200 ml  Output 750 ml  Net -550 ml   Filed Weights   02/15/20 0005  Weight: 135.2 kg   Gen: 34 y.o. male in no distress Pulm: Nonlabored breathing HFNC + NRB, tachypneic, crackles stable. CV: Regular rate and rhythm. No murmur, rub, or gallop. No JVD, no dependent edema. GI: Abdomen soft, non-tender, non-distended, with normoactive bowel sounds.  Ext: Warm, no  deformities Skin: No rashes, lesions or ulcers on visualized skin. Neuro: Alert and oriented. No focal neurological deficits. Psych: Judgement and insight appear fair. Mood euthymic & affect congruent. Behavior is appropriate.      Data Reviewed: I have personally reviewed following labs and imaging studies  CBC: Recent Labs  Lab 02/14/20 1141 02/15/20 0659 02/16/20 0444 02/17/20 0537 02/18/20 0502  WBC 8.6 10.6* 12.9* 14.4* 17.6*  NEUTROABS 7.3 9.0* 11.8* 12.4* 15.4*  HGB 13.9 13.9 14.0 13.9 14.4  HCT 45.0 44.2 44.8 45.6 46.3  MCV 89.1 88.6 88.9 91.6 89.0  PLT 179 193 247 275 305   Basic Metabolic Panel: Recent Labs  Lab 02/14/20 1141 02/15/20 0659 02/16/20 0444 02/17/20 0537 02/18/20 0502  NA 142 142 143 142 142  K 4.2 4.3 4.4 4.7 4.9  CL 104 106 107 106 106  CO2 25 27 25 26 23   GLUCOSE 118* 127* 134* 129* 116*  BUN 22* 22* 25* 27* 26*  CREATININE 1.14 0.90 0.80 0.89 0.86  CALCIUM 8.5* 8.4* 8.8* 8.7* 8.5*   GFR: Estimated Creatinine Clearance: 156.7 mL/min (by C-G formula based on SCr of 0.86 mg/dL). Liver Function Tests: Recent Labs  Lab 02/14/20 1141 02/15/20 0659 02/16/20 0444 02/17/20 0537 02/18/20 0502  AST 109* 75* 59* 56* 67*  ALT 66* 56* 52* 54* 70*  ALKPHOS 41 44 60 69 77  BILITOT 0.6 0.4 0.4 0.6 0.7  PROT 8.3* 8.1 8.0 8.0 7.9  ALBUMIN 3.8 3.6 3.4* 3.5 3.6   Lipid Profile: No results for input(s): CHOL, HDL, LDLCALC, TRIG, CHOLHDL, LDLDIRECT in the last 72 hours. Thyroid Function Tests: No results for input(s): TSH, T4TOTAL, FREET4, T3FREE, THYROIDAB in the last 72 hours. Anemia Panel: No results for input(s): VITAMINB12, FOLATE, FERRITIN, TIBC, IRON, RETICCTPCT in the last 72 hours. Urine analysis:    Component Value Date/Time   COLORURINE YELLOW 06/01/2007 0606   APPEARANCEUR CLEAR 06/01/2007 0606   LABSPEC >1.030 (H) 06/01/2007 0606   PHURINE 6.0 06/01/2007 0606   GLUCOSEU NEGATIVE 06/01/2007 0606   HGBUR MODERATE (A) 06/01/2007 0606    BILIRUBINUR SMALL (A) 06/01/2007 0606   KETONESUR NEGATIVE 06/01/2007 0606   PROTEINUR NEGATIVE 06/01/2007 0606   UROBILINOGEN 0.2 06/01/2007 0606   NITRITE NEGATIVE 06/01/2007 0606   LEUKOCYTESUR NEGATIVE 06/01/2007 0606   Recent Results (from the past 240 hour(s))  Respiratory Panel by RT PCR (Flu A&B, Covid) - Nasopharyngeal Swab     Status: Abnormal   Collection Time: 02/14/20 11:41 AM   Specimen: Nasopharyngeal Swab  Result Value Ref Range Status   SARS Coronavirus 2 by RT PCR POSITIVE (A) NEGATIVE Final    Comment: RESULT CALLED TO, READ BACK BY AND VERIFIED WITH: WEST,S. RN @1335  02/14/20 BILLINGSLEY,L (NOTE) SARS-CoV-2 target nucleic acids are DETECTED.  SARS-CoV-2 RNA is generally detectable in upper respiratory specimens  during the acute phase of infection. Positive results are indicative of the presence of the identified virus, but do not rule out bacterial infection or co-infection with other pathogens not detected by  the test. Clinical correlation with patient history and other diagnostic information is necessary to determine patient infection status. The expected result is Negative.  Fact Sheet for Patients:  https://www.moore.com/https://www.fda.gov/media/142436/download  Fact Sheet for Healthcare Providers: https://www.young.biz/https://www.fda.gov/media/142435/download  This test is not yet approved or cleared by the [place] FDA and  has been authorized for detection and/or diagnosis of SARS-CoV-2 by FDA under an Emergency Use Authorization (EUA).  This EUA will remain in effect (meaning this test ca n be used) for the duration of  the COVID-19 declaration under Section 564(b)(1) of the Act, 21 U.S.C. section 360bbb-3(b)(1), unless the authorization is terminated or revoked sooner.      Influenza A by PCR NEGATIVE NEGATIVE Final   Influenza B by PCR NEGATIVE NEGATIVE Final    Comment: (NOTE) The Xpert Xpress SARS-CoV-2/FLU/RSV assay is intended as an aid in  the diagnosis of influenza  from Nasopharyngeal swab specimens and  should not be used as a sole basis for treatment. Nasal washings and  aspirates are unacceptable for Xpert Xpress SARS-CoV-2/FLU/RSV  testing.  Fact Sheet for Patients: https://www.moore.com/https://www.fda.gov/media/142436/download  Fact Sheet for Healthcare Providers: https://www.young.biz/https://www.fda.gov/media/142435/download  This test is not yet approved or cleared by the [place] FDA and  has been authorized for detection and/or diagnosis of SARS-CoV-2 by  FDA under an Emergency Use Authorization (EUA). This EUA will remain  in effect (meaning this test can be used) for the duration of the  Covid-19 declaration under Section 564(b)(1) of the Act, 21  U.S.C. section 360bbb-3(b)(1), unless the authorization is  terminated or revoked. Performed at [facility], [address] [place], [place] [id number]       Radiology Studies: CT ANGIO CHEST PE W OR WO CONTRAST  Result Date: 02/16/2020 CLINICAL DATA:  34 year old male with positive D-dimer. Concern for pulmonary embolism. Positive COVID-19. EXAM: CT ANGIOGRAPHY CHEST WITH CONTRAST TECHNIQUE: Multidetector CT imaging of the chest was performed using the standard protocol during bolus administration of intravenous contrast. Multiplanar CT image reconstructions and MIPs were obtained to evaluate the vascular anatomy. CONTRAST:  100mL OMNIPAQUE IOHEXOL 350 MG/ML SOLN COMPARISON:  Chest CT dated 02/14/2020. FINDINGS: Evaluation of this exam is limited due to respiratory motion artifact. Cardiovascular: There is no cardiomegaly or pericardial effusion. The thoracic aorta is unremarkable. The origins of the great vessels of the aortic arch appear patent as visualized. Evaluation of the pulmonary arteries is very limited due to severe respiratory motion artifact. No definite large central pulmonary artery embolus identified. Mediastinum/Nodes: No obvious hilar adenopathy. Top-normal subcarinal lymph nodes, likely  reactive. The esophagus and the thyroid gland are grossly unremarkable. No mediastinal fluid collection. Lungs/Pleura: Diffuse bilateral pulmonary opacities consistent with multifocal pneumonia and in keeping with COVID-19. Small bilateral upper lobe pulmonary lacerations (26 and 30/6). There is no pleural effusion pneumothorax. The central airways are patent. Upper Abdomen: Fatty liver. Musculoskeletal: Degenerative changes of the spine. No acute osseous pathology. Review of the MIP images confirms the above findings. IMPRESSION: 1. No CT evidence of central pulmonary artery embolus. 2. Multifocal pneumonia and in keeping with COVID-19. 3. Small bilateral upper lobe pulmonary lacerations. No pneumothorax. 4. Fatty liver. Electronically Signed   By: Elgie CollardArash  Radparvar M.D.   On: 02/16/2020 18:25    Scheduled Meds: . azithromycin  500 mg Oral Q24H  . baricitinib  4 mg Oral Daily  . enoxaparin (LOVENOX) injection  65 mg Subcutaneous Q12H  . methylPREDNISolone (SOLU-MEDROL) injection  125 mg Intravenous BID  . sodium chloride flush  3 mL Intravenous Q12H   Continuous Infusions: . cefTRIAXone (ROCEPHIN)  IV 2 g (02/17/20 1451)  . remdesivir 100 mg in NS 100 mL 100 mg (02/18/20 0905)     LOS: 4 days   Time spent: 35 minutes.  Tyrone Nine, MD [facility] [email or web address] 02/18/2020, 3:14 PM

## 2020-02-19 DIAGNOSIS — I160 Hypertensive urgency: Secondary | ICD-10-CM | POA: Diagnosis not present

## 2020-02-19 DIAGNOSIS — J9601 Acute respiratory failure with hypoxia: Secondary | ICD-10-CM | POA: Diagnosis not present

## 2020-02-19 DIAGNOSIS — U071 COVID-19: Secondary | ICD-10-CM | POA: Diagnosis not present

## 2020-02-19 LAB — CBC WITH DIFFERENTIAL/PLATELET
Abs Immature Granulocytes: 0.37 10*3/uL — ABNORMAL HIGH (ref 0.00–0.07)
Basophils Absolute: 0 10*3/uL (ref 0.0–0.1)
Basophils Relative: 0 %
Eosinophils Absolute: 0 10*3/uL (ref 0.0–0.5)
Eosinophils Relative: 0 %
HCT: 46.6 % (ref 39.0–52.0)
Hemoglobin: 14.6 g/dL (ref 13.0–17.0)
Immature Granulocytes: 2 %
Lymphocytes Relative: 3 %
Lymphs Abs: 0.5 10*3/uL — ABNORMAL LOW (ref 0.7–4.0)
MCH: 27.6 pg (ref 26.0–34.0)
MCHC: 31.3 g/dL (ref 30.0–36.0)
MCV: 88.1 fL (ref 80.0–100.0)
Monocytes Absolute: 0.5 10*3/uL (ref 0.1–1.0)
Monocytes Relative: 3 %
Neutro Abs: 14.4 10*3/uL — ABNORMAL HIGH (ref 1.7–7.7)
Neutrophils Relative %: 92 %
Platelets: 359 10*3/uL (ref 150–400)
RBC: 5.29 MIL/uL (ref 4.22–5.81)
RDW: 13.5 % (ref 11.5–15.5)
WBC: 15.8 10*3/uL — ABNORMAL HIGH (ref 4.0–10.5)
nRBC: 0 % (ref 0.0–0.2)

## 2020-02-19 LAB — COMPREHENSIVE METABOLIC PANEL
ALT: 84 U/L — ABNORMAL HIGH (ref 0–44)
AST: 67 U/L — ABNORMAL HIGH (ref 15–41)
Albumin: 3.5 g/dL (ref 3.5–5.0)
Alkaline Phosphatase: 70 U/L (ref 38–126)
Anion gap: 11 (ref 5–15)
BUN: 26 mg/dL — ABNORMAL HIGH (ref 6–20)
CO2: 23 mmol/L (ref 22–32)
Calcium: 8.6 mg/dL — ABNORMAL LOW (ref 8.9–10.3)
Chloride: 104 mmol/L (ref 98–111)
Creatinine, Ser: 0.82 mg/dL (ref 0.61–1.24)
GFR, Estimated: >60 mL/min (ref >60)
Glucose, Bld: 131 mg/dL — ABNORMAL HIGH (ref 70–99)
Potassium: 4.7 mmol/L (ref 3.5–5.1)
Sodium: 138 mmol/L (ref 135–145)
Total Bilirubin: 1.1 mg/dL (ref 0.3–1.2)
Total Protein: 7.8 g/dL (ref 6.5–8.1)

## 2020-02-19 LAB — D-DIMER, QUANTITATIVE: D-Dimer, Quant: 5.47 ug/mL-FEU — ABNORMAL HIGH (ref 0.00–0.50)

## 2020-02-19 LAB — C-REACTIVE PROTEIN: CRP: 0.6 mg/dL (ref <1.0)

## 2020-02-19 NOTE — Progress Notes (Signed)
PROGRESS NOTE  Isaac Cochran  DVV:[id number] DOB: 30-Dec-1985 DOA: 02/14/2020 PCP: Oneita Hurt, No   Brief Narrative: Isaac Cochran is a 34 y.o. male with a history of obesity who was evaluated at urgent care for 2 weeks of dyspnea and cough found to have room air SpO2 in 50%'s improved with NRB prompting transfer to ED on 02/14/2020. SARS-CoV-2 Ag and PCR were positive, inflammatory markers elevated (CRP 5.5, PCT 0.24), and CXR demonstrated bilateral airspace infiltrates. Ct revealed moderately severe diffuse GGOs and interstitial infiltrates without PE or lobar consolidation. He was admitted, started on remdesivir, steroids, baricitinib, and antibiotics. He's continued to require 15L HFNC and NRB with no worsening of respiratory effort.  Assessment & Plan: Principal Problem:   Acute hypoxemic respiratory failure due to COVID-19 [facility]) Active Problems:   Hypertensive urgency  Acute hypoxemic respiratory failure due to covid-19 pneumonia and superimposed CAP: SARS-CoV-2 PCR and Ag positive on 11/10. - Continues to require high level of oxygen supplementation with no significant improvement. Wean as tolerated, OOB encouraged again today. - Continue remdesivir to complete 5th dose 11/17. - Continue steroids. CRP normal though d-dimer continues to be elevated, has remained roughly stable with intermediate lovenox dosing. Has had negative LE U/S and no PE on 2 different CTA chest. Will continue augmented lovenox dosing. - Continue baricitinib, started 11/10 - Continued empiric abx (11/10 - 11/14) with bibasilar consolidation. - Encourage IS - Continue airborne, contact precautions for 21 days from positive testing. - Monitor CMP and inflammatory markers  LFT elevations:  - Improving  D-dimer elevation: In concert with rising oxygen requirements, though had negative CTA chest initially. LE venous U/S without DVT and CTA chest repeated 11/12 and again negative for PE on my personal review which  confirms continued widespread GGOs. D-dimer down slightly, still very elevated. - Continue intermediate lovenox dosing (0.5mg /kg q12h)  Hypertensive urgency: Improving. Normotensive. - prn labetalol  Insomnia:  - Trazodone qHS prn.  Morbid obesity: Estimated body mass index is 48.1 kg/m as calculated from the following:   Height as of this encounter: 5\' 6"  (1.676 m).   Weight as of this encounter: 135.2 kg.  DVT prophylaxis: Lovenox 0.5mg /kg q12h Code Status: Full Family Communication: None at bedside. Wife by phone daily Disposition Plan:  Status is: Inpatient  Remains inpatient appropriate because:Inpatient level of care appropriate due to severity of illness  Dispo: The patient is from: Home              Anticipated d/c is to: Home              Anticipated d/c date is: > 3 days              Patient currently is not medically stable to d/c.  Consultants:   None  Procedures:   None  Antimicrobials:  Remdesivir, ceftriaxone, azithromycin   Subjective: Dyspnea at rest actually seems to have improved from yesterday, though still on HFNC + NRB, will try to take NRB off. No chest pain, no LE pain/swelling or other new issues.  Objective: Vitals:   02/18/20 1547 02/18/20 2121 02/19/20 0504 02/19/20 0800  BP:  114/80 (!) 152/137   Pulse: 87 83 85   Resp: (!) 23 (!) 22 20   Temp:  97.6 F (36.4 C) 97.6 F (36.4 C)   TempSrc:  Oral Oral   SpO2: 95% 97% 94% 95%  Weight:      Height:        Intake/Output Summary (Last  24 hours) at 02/19/2020 1107 Last data filed at 02/19/2020 0800 Gross per 24 hour  Intake 340 ml  Output 1201 ml  Net -861 ml   Filed Weights   02/15/20 0005  Weight: 135.2 kg   Gen: 34 y.o. male in no distress Pulm: Nonlabored breathing 15L HFNC, SpO2 down to 83% at complete rest when he takes this off, crackles bilaterally/stable. CV: Regular rate and rhythm. No murmur, rub, or gallop. No JVD, no dependent edema. GI: Abdomen soft,  non-tender, non-distended, with normoactive bowel sounds.  Ext: Warm, no deformities Skin: No rashes, lesions or ulcers on visualized skin. Neuro: Alert and oriented. No focal neurological deficits. Psych: Judgement and insight appear fair. Mood euthymic & affect congruent. Behavior is appropriate.     Data Reviewed: I have personally reviewed following labs and imaging studies  CBC: Recent Labs  Lab 02/15/20 0659 02/16/20 0444 02/17/20 0537 02/18/20 0502 02/19/20 0425  WBC 10.6* 12.9* 14.4* 17.6* 15.8*  NEUTROABS 9.0* 11.8* 12.4* 15.4* 14.4*  HGB 13.9 14.0 13.9 14.4 14.6  HCT 44.2 44.8 45.6 46.3 46.6  MCV 88.6 88.9 91.6 89.0 88.1  PLT 193 247 275 305 359   Basic Metabolic Panel: Recent Labs  Lab 02/15/20 0659 02/16/20 0444 02/17/20 0537 02/18/20 0502 02/19/20 0425  NA 142 143 142 142 138  K 4.3 4.4 4.7 4.9 4.7  CL 106 107 106 106 104  CO2 27 25 26 23 23   GLUCOSE 127* 134* 129* 116* 131*  BUN 22* 25* 27* 26* 26*  CREATININE 0.90 0.80 0.89 0.86 0.82  CALCIUM 8.4* 8.8* 8.7* 8.5* 8.6*   GFR: Estimated Creatinine Clearance: 164.3 mL/min (by C-G formula based on SCr of 0.82 mg/dL). Liver Function Tests: Recent Labs  Lab 02/15/20 0659 02/16/20 0444 02/17/20 0537 02/18/20 0502 02/19/20 0425  AST 75* 59* 56* 67* 67*  ALT 56* 52* 54* 70* 84*  ALKPHOS 44 60 69 77 70  BILITOT 0.4 0.4 0.6 0.7 1.1  PROT 8.1 8.0 8.0 7.9 7.8  ALBUMIN 3.6 3.4* 3.5 3.6 3.5   Lipid Profile: No results for input(s): CHOL, HDL, LDLCALC, TRIG, CHOLHDL, LDLDIRECT in the last 72 hours. Thyroid Function Tests: No results for input(s): TSH, T4TOTAL, FREET4, T3FREE, THYROIDAB in the last 72 hours. Anemia Panel: No results for input(s): VITAMINB12, FOLATE, FERRITIN, TIBC, IRON, RETICCTPCT in the last 72 hours. Urine analysis:    Component Value Date/Time   COLORURINE YELLOW 06/01/2007 0606   APPEARANCEUR CLEAR 06/01/2007 0606   LABSPEC >1.030 (H) 06/01/2007 0606   PHURINE 6.0 06/01/2007 0606    GLUCOSEU NEGATIVE 06/01/2007 0606   HGBUR MODERATE (A) 06/01/2007 0606   BILIRUBINUR SMALL (A) 06/01/2007 0606   KETONESUR NEGATIVE 06/01/2007 0606   PROTEINUR NEGATIVE 06/01/2007 0606   UROBILINOGEN 0.2 06/01/2007 0606   NITRITE NEGATIVE 06/01/2007 0606   LEUKOCYTESUR NEGATIVE 06/01/2007 0606   Recent Results (from the past 240 hour(s))  Respiratory Panel by RT PCR (Flu A&B, Covid) - Nasopharyngeal Swab     Status: Abnormal   Collection Time: 02/14/20 11:41 AM   Specimen: Nasopharyngeal Swab  Result Value Ref Range Status   SARS Coronavirus 2 by RT PCR POSITIVE (A) NEGATIVE Final    Comment: RESULT CALLED TO, READ BACK BY AND VERIFIED WITH: WEST,S. RN @1335  02/14/20 BILLINGSLEY,L (NOTE) SARS-CoV-2 target nucleic acids are DETECTED.  SARS-CoV-2 RNA is generally detectable in upper respiratory specimens  during the acute phase of infection. Positive results are indicative of the presence of the identified virus, but  do not rule out bacterial infection or co-infection with other pathogens not detected by the test. Clinical correlation with patient history and other diagnostic information is necessary to determine patient infection status. The expected result is Negative.  Fact Sheet for Patients:  https://www.moore.com/  Fact Sheet for Healthcare Providers: https://www.young.biz/  This test is not yet approved or cleared by the [place] FDA and  has been authorized for detection and/or diagnosis of SARS-CoV-2 by FDA under an Emergency Use Authorization (EUA).  This EUA will remain in effect (meaning this test ca n be used) for the duration of  the COVID-19 declaration under Section 564(b)(1) of the Act, 21 U.S.C. section 360bbb-3(b)(1), unless the authorization is terminated or revoked sooner.      Influenza A by PCR NEGATIVE NEGATIVE Final   Influenza B by PCR NEGATIVE NEGATIVE Final    Comment: (NOTE) The Xpert Xpress  SARS-CoV-2/FLU/RSV assay is intended as an aid in  the diagnosis of influenza from Nasopharyngeal swab specimens and  should not be used as a sole basis for treatment. Nasal washings and  aspirates are unacceptable for Xpert Xpress SARS-CoV-2/FLU/RSV  testing.  Fact Sheet for Patients: https://www.moore.com/  Fact Sheet for Healthcare Providers: https://www.young.biz/  This test is not yet approved or cleared by the [place] FDA and  has been authorized for detection and/or diagnosis of SARS-CoV-2 by  FDA under an Emergency Use Authorization (EUA). This EUA will remain  in effect (meaning this test can be used) for the duration of the  Covid-19 declaration under Section 564(b)(1) of the Act, 21  U.S.C. section 360bbb-3(b)(1), unless the authorization is  terminated or revoked. Performed at [facility], [address] [place], [place] [address]       Radiology Studies: No results found.  Scheduled Meds: . baricitinib  4 mg Oral Daily  . enoxaparin (LOVENOX) injection  65 mg Subcutaneous Q12H  . methylPREDNISolone (SOLU-MEDROL) injection  125 mg Intravenous BID  . sodium chloride flush  3 mL Intravenous Q12H   Continuous Infusions: . remdesivir 100 mg in NS 100 mL 100 mg (02/19/20 1026)     LOS: 5 days   Time spent: 35 minutes.  Tyrone Nine, MD [facility] [email or web address] 02/19/2020, 11:07 AM

## 2020-02-20 DIAGNOSIS — I160 Hypertensive urgency: Secondary | ICD-10-CM | POA: Diagnosis not present

## 2020-02-20 DIAGNOSIS — U071 COVID-19: Secondary | ICD-10-CM | POA: Diagnosis not present

## 2020-02-20 DIAGNOSIS — J9601 Acute respiratory failure with hypoxia: Secondary | ICD-10-CM | POA: Diagnosis not present

## 2020-02-20 LAB — COMPREHENSIVE METABOLIC PANEL
ALT: 92 U/L — ABNORMAL HIGH (ref 0–44)
AST: 52 U/L — ABNORMAL HIGH (ref 15–41)
Albumin: 3.8 g/dL (ref 3.5–5.0)
Alkaline Phosphatase: 82 U/L (ref 38–126)
Anion gap: 10 (ref 5–15)
BUN: 27 mg/dL — ABNORMAL HIGH (ref 6–20)
CO2: 21 mmol/L — ABNORMAL LOW (ref 22–32)
Calcium: 8.8 mg/dL — ABNORMAL LOW (ref 8.9–10.3)
Chloride: 103 mmol/L (ref 98–111)
Creatinine, Ser: 0.57 mg/dL — ABNORMAL LOW (ref 0.61–1.24)
GFR, Estimated: >60 mL/min (ref >60)
Glucose, Bld: 133 mg/dL — ABNORMAL HIGH (ref 70–99)
Potassium: 5 mmol/L (ref 3.5–5.1)
Sodium: 134 mmol/L — ABNORMAL LOW (ref 135–145)
Total Bilirubin: 1 mg/dL (ref 0.3–1.2)
Total Protein: 8.3 g/dL — ABNORMAL HIGH (ref 6.5–8.1)

## 2020-02-20 LAB — D-DIMER, QUANTITATIVE: D-Dimer, Quant: 3.14 ug/mL-FEU — ABNORMAL HIGH (ref 0.00–0.50)

## 2020-02-20 MED ORDER — TRAZODONE HCL 100 MG PO TABS
100.0000 mg | ORAL_TABLET | Freq: Every day | ORAL | Status: DC
  Administered 2020-02-20 – 2020-02-24 (×5): 100 mg via ORAL
  Filled 2020-02-20 (×5): qty 1

## 2020-02-20 NOTE — Plan of Care (Signed)
  Problem: Education: Goal: Knowledge of General Education information will improve Description: Including pain rating scale, medication(s)/side effects and non-pharmacologic comfort measures 02/20/2020 2222 by Rick Duff, RN Outcome: Progressing 02/20/2020 2221 by Rick Duff, RN Outcome: Progressing   Problem: Health Behavior/Discharge Planning: Goal: Ability to manage health-related needs will improve 02/20/2020 2222 by Rick Duff, RN Outcome: Progressing 02/20/2020 2221 by Rick Duff, RN Outcome: Progressing   Problem: Clinical Measurements: Goal: Ability to maintain clinical measurements within normal limits will improve 02/20/2020 2222 by Rick Duff, RN Outcome: Progressing 02/20/2020 2221 by Rick Duff, RN Outcome: Progressing Goal: Will remain free from infection 02/20/2020 2222 by Rick Duff, RN Outcome: Progressing 02/20/2020 2221 by Rick Duff, RN Outcome: Progressing Goal: Diagnostic test results will improve 02/20/2020 2222 by Rick Duff, RN Outcome: Progressing 02/20/2020 2221 by Rick Duff, RN Outcome: Progressing Goal: Respiratory complications will improve 02/20/2020 2222 by Rick Duff, RN Outcome: Progressing 02/20/2020 2221 by Rick Duff, RN Outcome: Progressing Goal: Cardiovascular complication will be avoided 02/20/2020 2222 by Rick Duff, RN Outcome: Progressing 02/20/2020 2221 by Rick Duff, RN Outcome: Progressing   Problem: Activity: Goal: Risk for activity intolerance will decrease 02/20/2020 2222 by Rick Duff, RN Outcome: Progressing 02/20/2020 2221 by Rick Duff, RN Outcome: Progressing   Problem: Nutrition: Goal: Adequate nutrition will be maintained 02/20/2020 2222 by Rick Duff, RN Outcome: Progressing 02/20/2020 2221 by Rick Duff, RN Outcome: Progressing   Problem: Coping: Goal: Level of anxiety will decrease 02/20/2020 2222 by Rick Duff, RN Outcome: Progressing 02/20/2020 2221 by Rick Duff, RN Outcome: Progressing   Problem: Elimination: Goal: Will not experience complications related to bowel motility 02/20/2020 2222 by Rick Duff, RN Outcome: Progressing 02/20/2020 2221 by Rick Duff, RN Outcome: Progressing Goal: Will not experience complications related to urinary retention 02/20/2020 2222 by Rick Duff, RN Outcome: Progressing 02/20/2020 2221 by Rick Duff, RN Outcome: Progressing   Problem: Pain Managment: Goal: General experience of comfort will improve 02/20/2020 2222 by Rick Duff, RN Outcome: Progressing 02/20/2020 2221 by Rick Duff, RN Outcome: Progressing   Problem: Safety: Goal: Ability to remain free from injury will improve 02/20/2020 2222 by Rick Duff, RN Outcome: Progressing 02/20/2020 2221 by Rick Duff, RN Outcome: Progressing   Problem: Skin Integrity: Goal: Risk for impaired skin integrity will decrease 02/20/2020 2222 by Rick Duff, RN Outcome: Progressing 02/20/2020 2221 by Rick Duff, RN Outcome: Progressing   Problem: Education: Goal: Knowledge of risk factors and measures for prevention of condition will improve Outcome: Progressing   Problem: Coping: Goal: Psychosocial and spiritual needs will be supported Outcome: Progressing   Problem: Respiratory: Goal: Will maintain a patent airway Outcome: Progressing Goal: Complications related to the disease process, condition or treatment will be avoided or minimized Outcome: Progressing

## 2020-02-20 NOTE — Plan of Care (Signed)

## 2020-02-20 NOTE — Progress Notes (Signed)
PROGRESS NOTE  Isaac Cochran  UTM:[id number] DOB: 06/07/1985 DOA: 02/14/2020 PCP: Oneita Hurt, No   Brief Narrative: Isaac Cochran is a 34 y.o. male with a history of obesity who was evaluated at urgent care for 2 weeks of dyspnea and cough found to have room air SpO2 in 50%'s improved with NRB prompting transfer to ED on 02/14/2020. SARS-CoV-2 Ag and PCR were positive, inflammatory markers elevated (CRP 5.5, PCT 0.24), and CXR demonstrated bilateral airspace infiltrates. Ct revealed moderately severe diffuse GGOs and interstitial infiltrates without PE or lobar consolidation. He was admitted, started on remdesivir, steroids, baricitinib, and antibiotics. He's continued to require 15L HFNC and NRB with no worsening of respiratory effort.  Assessment & Plan: Principal Problem:   Acute hypoxemic respiratory failure due to COVID-19 [facility]) Active Problems:   Hypertensive urgency  Acute hypoxemic respiratory failure due to covid-19 pneumonia and superimposed CAP: SARS-CoV-2 PCR and Ag positive on 11/10. - Continues to require high level of oxygen supplementation with no significant improvement. Wean as tolerated, OOB encouraged again today. His strength is improving.  - Continue remdesivir to complete 5th dose 11/17. - Continue steroids.   - Continue baricitinib, started 11/10 - Completed empiric abx (11/10 - 11/14) with bibasilar consolidation and use of immunomodulator. - Encourage IS - Continue airborne, contact precautions for 21 days from positive testing. - Monitor CMP and inflammatory markers  LFT elevations:  - Improving  D-dimer elevation: In concert with rising oxygen requirements, though had negative CTA chest initially. LE venous U/S without DVT and CTA chest repeated 11/12 and again negative for PE on my personal review which confirms continued widespread GGOs. D-dimer down slightly, still very elevated. - Continue intermediate lovenox dosing (0.5mg /kg q12h). Has had negative LE U/S  and no PE on 2 different CTA chest. D-dimer slightly down today.   Hypertensive urgency: Improving. Normotensive. - prn labetalol  Insomnia:  - Trazodone qHS prn. Will increase dose today.  Morbid obesity: Estimated body mass index is 48.1 kg/m as calculated from the following:   Height as of this encounter: 5\' 6"  (1.676 m).   Weight as of this encounter: 135.2 kg.  DVT prophylaxis: Lovenox 0.5mg /kg q12h Code Status: Full Family Communication: None at bedside. Wife by phone daily Disposition Plan:  Status is: Inpatient  Remains inpatient appropriate because:Inpatient level of care appropriate due to severity of illness  Dispo: The patient is from: Home              Anticipated d/c is to: Home              Anticipated d/c date is: > 3 days              Patient currently is not medically stable to d/c.  Consultants:   None  Procedures:   None  Antimicrobials:  Remdesivir, ceftriaxone, azithromycin   Subjective: Unable to wean from NRB over past 24 hours, feels very short of breath with exertion (which is constant on exertion, improved with rest, severe), but able to get up more readily today.   Objective: Vitals:   02/19/20 2130 02/20/20 0338 02/20/20 0424 02/20/20 0808  BP:   118/82   Pulse:   94 94  Resp:   20 (!) 24  Temp:   98.2 F (36.8 C)   TempSrc:   Oral   SpO2: 94% 96% 97% 91%  Weight:      Height:        Intake/Output Summary (Last 24 hours) at 02/20/2020  1148 Last data filed at 02/20/2020 1610 Gross per 24 hour  Intake 793 ml  Output 1750 ml  Net -957 ml   Filed Weights   02/15/20 0005  Weight: 135.2 kg   Gen: 34 y.o. male in no distress Pulm: Nonlabored but tachypneic, mildly labored when off NRB, crackles stable bilaterally. CV: Regular rate and rhythm. No murmur, rub, or gallop. No JVD, no dependent edema. GI: Abdomen soft, non-tender, non-distended, with normoactive bowel sounds.  Ext: Warm, no deformities Skin: No rashes, lesions or  ulcers on visualized skin. Neuro: Alert and oriented. No focal neurological deficits. Psych: Judgement and insight appear fair. Mood euthymic & affect congruent. Behavior is appropriate.    Data Reviewed: I have personally reviewed following labs and imaging studies  CBC: Recent Labs  Lab 02/15/20 0659 02/16/20 0444 02/17/20 0537 02/18/20 0502 02/19/20 0425  WBC 10.6* 12.9* 14.4* 17.6* 15.8*  NEUTROABS 9.0* 11.8* 12.4* 15.4* 14.4*  HGB 13.9 14.0 13.9 14.4 14.6  HCT 44.2 44.8 45.6 46.3 46.6  MCV 88.6 88.9 91.6 89.0 88.1  PLT 193 247 275 305 359   Basic Metabolic Panel: Recent Labs  Lab 02/16/20 0444 02/17/20 0537 02/18/20 0502 02/19/20 0425 02/20/20 0502  NA 143 142 142 138 134*  K 4.4 4.7 4.9 4.7 5.0  CL 107 106 106 104 103  CO2 25 26 23 23  21*  GLUCOSE 134* 129* 116* 131* 133*  BUN 25* 27* 26* 26* 27*  CREATININE 0.80 0.89 0.86 0.82 0.57*  CALCIUM 8.8* 8.7* 8.5* 8.6* 8.8*   GFR: Estimated Creatinine Clearance: 168.4 mL/min (A) (by C-G formula based on SCr of 0.57 mg/dL (L)). Liver Function Tests: Recent Labs  Lab 02/16/20 0444 02/17/20 0537 02/18/20 0502 02/19/20 0425 02/20/20 0502  AST 59* 56* 67* 67* 52*  ALT 52* 54* 70* 84* 92*  ALKPHOS 60 69 77 70 82  BILITOT 0.4 0.6 0.7 1.1 1.0  PROT 8.0 8.0 7.9 7.8 8.3*  ALBUMIN 3.4* 3.5 3.6 3.5 3.8   Lipid Profile: No results for input(s): CHOL, HDL, LDLCALC, TRIG, CHOLHDL, LDLDIRECT in the last 72 hours. Thyroid Function Tests: No results for input(s): TSH, T4TOTAL, FREET4, T3FREE, THYROIDAB in the last 72 hours. Anemia Panel: No results for input(s): VITAMINB12, FOLATE, FERRITIN, TIBC, IRON, RETICCTPCT in the last 72 hours. Urine analysis:    Component Value Date/Time   COLORURINE YELLOW 06/01/2007 0606   APPEARANCEUR CLEAR 06/01/2007 0606   LABSPEC >1.030 (H) 06/01/2007 0606   PHURINE 6.0 06/01/2007 0606   GLUCOSEU NEGATIVE 06/01/2007 0606   HGBUR MODERATE (A) 06/01/2007 0606   BILIRUBINUR SMALL (A)  06/01/2007 0606   KETONESUR NEGATIVE 06/01/2007 0606   PROTEINUR NEGATIVE 06/01/2007 0606   UROBILINOGEN 0.2 06/01/2007 0606   NITRITE NEGATIVE 06/01/2007 0606   LEUKOCYTESUR NEGATIVE 06/01/2007 0606   Recent Results (from the past 240 hour(s))  Respiratory Panel by RT PCR (Flu A&B, Covid) - Nasopharyngeal Swab     Status: Abnormal   Collection Time: 02/14/20 11:41 AM   Specimen: Nasopharyngeal Swab  Result Value Ref Range Status   SARS Coronavirus 2 by RT PCR POSITIVE (A) NEGATIVE Final    Comment: RESULT CALLED TO, READ BACK BY AND VERIFIED WITH: WEST,S. RN @1335  02/14/20 BILLINGSLEY,L (NOTE) SARS-CoV-2 target nucleic acids are DETECTED.  SARS-CoV-2 RNA is generally detectable in upper respiratory specimens  during the acute phase of infection. Positive results are indicative of the presence of the identified virus, but do not rule out bacterial infection or co-infection with other  pathogens not detected by the test. Clinical correlation with patient history and other diagnostic information is necessary to determine patient infection status. The expected result is Negative.  Fact Sheet for Patients:  https://www.moore.com/  Fact Sheet for Healthcare Providers: https://www.young.biz/  This test is not yet approved or cleared by the [place] FDA and  has been authorized for detection and/or diagnosis of SARS-CoV-2 by FDA under an Emergency Use Authorization (EUA).  This EUA will remain in effect (meaning this test ca n be used) for the duration of  the COVID-19 declaration under Section 564(b)(1) of the Act, 21 U.S.C. section 360bbb-3(b)(1), unless the authorization is terminated or revoked sooner.      Influenza A by PCR NEGATIVE NEGATIVE Final   Influenza B by PCR NEGATIVE NEGATIVE Final    Comment: (NOTE) The Xpert Xpress SARS-CoV-2/FLU/RSV assay is intended as an aid in  the diagnosis of influenza from Nasopharyngeal swab  specimens and  should not be used as a sole basis for treatment. Nasal washings and  aspirates are unacceptable for Xpert Xpress SARS-CoV-2/FLU/RSV  testing.  Fact Sheet for Patients: https://www.moore.com/  Fact Sheet for Healthcare Providers: https://www.young.biz/  This test is not yet approved or cleared by the [place] FDA and  has been authorized for detection and/or diagnosis of SARS-CoV-2 by  FDA under an Emergency Use Authorization (EUA). This EUA will remain  in effect (meaning this test can be used) for the duration of the  Covid-19 declaration under Section 564(b)(1) of the Act, 21  U.S.C. section 360bbb-3(b)(1), unless the authorization is  terminated or revoked. Performed at [facility], [address] [place], [place] [address]       Radiology Studies: No results found.  Scheduled Meds: . baricitinib  4 mg Oral Daily  . enoxaparin (LOVENOX) injection  65 mg Subcutaneous Q12H  . methylPREDNISolone (SOLU-MEDROL) injection  125 mg Intravenous BID  . sodium chloride flush  3 mL Intravenous Q12H   Continuous Infusions: . remdesivir 100 mg in NS 100 mL 100 mg (02/20/20 0945)     LOS: 6 days   Time spent: 35 minutes.  Tyrone Nine, MD [facility] [email or web address] 02/20/2020, 11:48 AM

## 2020-02-20 NOTE — TOC Progression Note (Signed)
Transition of Care [facility]) - Progression Note    Patient Details  Name: Isaac Cochran MRN: [id number] Date of Birth: 18-Aug-1985  [facility]) CM/SW Contact  Geni Bers, RN Phone Number: 02/20/2020, 2:46 PM  Clinical Narrative:     Pt from home with spouse. TOC will continue to follow.  Expected Discharge Plan: Home/Self Care Barriers to Discharge: No Barriers Identified  Expected Discharge Plan and Services Expected Discharge Plan: Home/Self Care       Living arrangements for the past 2 months: Single Family Home                                       Social Determinants of Health (SDOH) Interventions    Readmission Risk Interventions No flowsheet data found.

## 2020-02-21 DIAGNOSIS — J9601 Acute respiratory failure with hypoxia: Secondary | ICD-10-CM | POA: Diagnosis not present

## 2020-02-21 DIAGNOSIS — R7989 Other specified abnormal findings of blood chemistry: Secondary | ICD-10-CM

## 2020-02-21 DIAGNOSIS — U071 COVID-19: Secondary | ICD-10-CM | POA: Diagnosis not present

## 2020-02-21 DIAGNOSIS — J189 Pneumonia, unspecified organism: Secondary | ICD-10-CM

## 2020-02-21 DIAGNOSIS — I160 Hypertensive urgency: Secondary | ICD-10-CM | POA: Diagnosis not present

## 2020-02-21 DIAGNOSIS — J1282 Pneumonia due to coronavirus disease 2019: Secondary | ICD-10-CM

## 2020-02-21 DIAGNOSIS — E66813 Obesity, class 3: Secondary | ICD-10-CM

## 2020-02-21 MED ORDER — METHYLPREDNISOLONE SODIUM SUCC 125 MG IJ SOLR
80.0000 mg | Freq: Two times a day (BID) | INTRAMUSCULAR | Status: DC
  Administered 2020-02-21 – 2020-02-22 (×3): 80 mg via INTRAVENOUS
  Filled 2020-02-21 (×3): qty 2

## 2020-02-21 NOTE — Progress Notes (Signed)
[facility] PROGRESS NOTE    Progress Note  CAILAN GENERAL  VHQ:[id number] DOB: 08-29-1985 DOA: 02/14/2020 PCP: Pcp, No     Brief Narrative:   Isaac Cochran is an 34 y.o. male unvaccinated for COVID-19 who presented to the urgent care for 2-week of dyspnea and cough was found to be satting 50% on room air placed on a nonrebreather tested positive for SARS-CoV-2 was transfer to [facility] requiring 15 L of high flow nasal cannula  Assessment/Plan:   Acute hypoxemic respiratory failure due to COVID-19 and Bilateral multifocal pneumonia: He was requiring 15 L of high flow nasal cannula, he has been weaned down to 5 L of oxygen today. He relates his breathing is better than yesterday, his inflammatory markers are going down, his CRP is below one. Continue IV steroids and oral baricitinib.  He completed his course of community-acquired pneumonia.  Hypertensive urgency: Improved continue labetalol IV as needed.  Elevated LFTs Due to fatty liver now improved.  Morbid obesity  DVT prophylaxis: Lovenox Family Communication:none Status is: Inpatient  Remains inpatient appropriate because:Hemodynamically unstable   Dispo: The patient is from: Home              Anticipated d/c is to: Home              Anticipated d/c date is: 2 days              Patient currently is not medically stable to d/c.        Code Status:     Code Status Orders  (From admission, onward)         Start     Ordered   02/14/20 1832  Full code  Continuous        02/14/20 1831        Code Status History    This patient has a current code status but no historical code status.   Advance Care Planning Activity        IV Access:    Peripheral IV   Procedures and diagnostic studies:   No results found.   Medical Consultants:    None.  Anti-Infectives:   none  Subjective:    [name] he relates his breathing is significantly better than  yesterday.  Objective:    Vitals:   02/20/20 1411 02/20/20 2018 02/20/20 2053 02/21/20 0434  BP: 138/90  118/80 114/86  Pulse: (!) 107  94 98  Resp: 19  20 20   Temp: 98.4 F (36.9 C)  98.3 F (36.8 C) 98.2 F (36.8 C)  TempSrc: Oral  Oral Oral  SpO2: 97% 96% 93% 97%  Weight:      Height:       SpO2: 97 % O2 Flow Rate (L/min): 15 L/min FiO2 (%): 100 %   Intake/Output Summary (Last 24 hours) at 02/21/2020 0727 Last data filed at 02/21/2020 0500 Gross per 24 hour  Intake 3 ml  Output 1400 ml  Net -1397 ml   Filed Weights   02/15/20 0005  Weight: 135.2 kg    Exam: General exam: In no acute distress. Respiratory system: Good air movement and clear to auscultation. Cardiovascular system: S1 & S2 heard, RRR. No JVD. Gastrointestinal system: Abdomen is nondistended, soft and nontender.  Extremities: No pedal edema. Skin: No rashes, lesions or ulcers Psychiatry: Judgement and insight appear normal. Mood & affect appropriate.    Data Reviewed:    Labs: Basic Metabolic Panel: Recent Labs  Lab 02/16/20  4332 02/16/20 0444 02/17/20 0537 02/17/20 0537 02/18/20 0502 02/18/20 0502 02/19/20 0425 02/20/20 0502  NA 143  --  142  --  142  --  138 134*  K 4.4   < > 4.7   < > 4.9   < > 4.7 5.0  CL 107  --  106  --  106  --  104 103  CO2 25  --  26  --  23  --  23 21*  GLUCOSE 134*  --  129*  --  116*  --  131* 133*  BUN 25*  --  27*  --  26*  --  26* 27*  CREATININE 0.80  --  0.89  --  0.86  --  0.82 0.57*  CALCIUM 8.8*  --  8.7*  --  8.5*  --  8.6* 8.8*   < > = values in this interval not displayed.   GFR Estimated Creatinine Clearance: 168.4 mL/min (A) (by C-G formula based on SCr of 0.57 mg/dL (L)). Liver Function Tests: Recent Labs  Lab 02/16/20 0444 02/17/20 0537 02/18/20 0502 02/19/20 0425 02/20/20 0502  AST 59* 56* 67* 67* 52*  ALT 52* 54* 70* 84* 92*  ALKPHOS 60 69 77 70 82  BILITOT 0.4 0.6 0.7 1.1 1.0  PROT 8.0 8.0 7.9 7.8 8.3*  ALBUMIN 3.4* 3.5  3.6 3.5 3.8   No results for input(s): LIPASE, AMYLASE in the last 168 hours. No results for input(s): AMMONIA in the last 168 hours. Coagulation profile No results for input(s): INR, PROTIME in the last 168 hours. COVID-19 Labs  Recent Labs    02/19/20 0425 02/20/20 0502  DDIMER 5.47* 3.14*  CRP 0.6  --     Lab Results  Component Value Date   SARSCOV2NAA POSITIVE (A) 02/14/2020    CBC: Recent Labs  Lab 02/15/20 0659 02/16/20 0444 02/17/20 0537 02/18/20 0502 02/19/20 0425  WBC 10.6* 12.9* 14.4* 17.6* 15.8*  NEUTROABS 9.0* 11.8* 12.4* 15.4* 14.4*  HGB 13.9 14.0 13.9 14.4 14.6  HCT 44.2 44.8 45.6 46.3 46.6  MCV 88.6 88.9 91.6 89.0 88.1  PLT 193 247 275 305 359   Cardiac Enzymes: No results for input(s): CKTOTAL, CKMB, CKMBINDEX, TROPONINI in the last 168 hours. BNP (last 3 results) No results for input(s): PROBNP in the last 8760 hours. CBG: Recent Labs  Lab 02/17/20 1714  GLUCAP 112*   D-Dimer: Recent Labs    02/19/20 0425 02/20/20 0502  DDIMER 5.47* 3.14*   Hgb A1c: No results for input(s): HGBA1C in the last 72 hours. Lipid Profile: No results for input(s): CHOL, HDL, LDLCALC, TRIG, CHOLHDL, LDLDIRECT in the last 72 hours. Thyroid function studies: No results for input(s): TSH, T4TOTAL, T3FREE, THYROIDAB in the last 72 hours.  Invalid input(s): FREET3 Anemia work up: No results for input(s): VITAMINB12, FOLATE, FERRITIN, TIBC, IRON, RETICCTPCT in the last 72 hours. Sepsis Labs: Recent Labs  Lab 02/14/20 1141 02/14/20 1400 02/15/20 0659 02/16/20 0444 02/17/20 0537 02/18/20 0502 02/19/20 0425  PROCALCITON 0.24  --   --   --   --   --   --   WBC 8.6  --    < > 12.9* 14.4* 17.6* 15.8*  LATICACIDVEN 1.6 1.3  --   --   --   --   --    < > = values in this interval not displayed.   Microbiology Recent Results (from the past 240 hour(s))  Respiratory Panel by RT PCR (Flu A&B, Covid) -  Nasopharyngeal Swab     Status: Abnormal   Collection Time:  02/14/20 11:41 AM   Specimen: Nasopharyngeal Swab  Result Value Ref Range Status   SARS Coronavirus 2 by RT PCR POSITIVE (A) NEGATIVE Final    Comment: RESULT CALLED TO, READ BACK BY AND VERIFIED WITH: WEST,S. RN @1335  02/14/20 BILLINGSLEY,L (NOTE) SARS-CoV-2 target nucleic acids are DETECTED.  SARS-CoV-2 RNA is generally detectable in upper respiratory specimens  during the acute phase of infection. Positive results are indicative of the presence of the identified virus, but do not rule out bacterial infection or co-infection with other pathogens not detected by the test. Clinical correlation with patient history and other diagnostic information is necessary to determine patient infection status. The expected result is Negative.  Fact Sheet for Patients:  13/10/21  Fact Sheet for Healthcare Providers: https://www.moore.com/  This test is not yet approved or cleared by the https://www.young.biz/ FDA and  has been authorized for detection and/or diagnosis of SARS-CoV-2 by FDA under an Emergency Use Authorization (EUA).  This EUA will remain in effect (meaning this test ca n be used) for the duration of  the COVID-19 declaration under Section 564(b)(1) of the Act, 21 U.S.C. section 360bbb-3(b)(1), unless the authorization is terminated or revoked sooner.      Influenza A by PCR NEGATIVE NEGATIVE Final   Influenza B by PCR NEGATIVE NEGATIVE Final    Comment: (NOTE) The Xpert Xpress SARS-CoV-2/FLU/RSV assay is intended as an aid in  the diagnosis of influenza from Nasopharyngeal swab specimens and  should not be used as a sole basis for treatment. Nasal washings and  aspirates are unacceptable for Xpert Xpress SARS-CoV-2/FLU/RSV  testing.  Fact Sheet for Patients: [place]  Fact Sheet for Healthcare Providers: https://www.moore.com/  This test is not yet approved or cleared by  the https://www.young.biz/ FDA and  has been authorized for detection and/or diagnosis of SARS-CoV-2 by  FDA under an Emergency Use Authorization (EUA). This EUA will remain  in effect (meaning this test can be used) for the duration of the  Covid-19 declaration under Section 564(b)(1) of the Act, 21  U.S.C. section 360bbb-3(b)(1), unless the authorization is  terminated or revoked. Performed at [facility], [address], [place], [place]      Medications:   . baricitinib  4 mg Oral Daily  . enoxaparin (LOVENOX) injection  65 mg Subcutaneous Q12H  . methylPREDNISolone (SOLU-MEDROL) injection  125 mg Intravenous BID  . sodium chloride flush  3 mL Intravenous Q12H  . traZODone  100 mg Oral QHS   Continuous Infusions: . remdesivir 100 mg in NS 100 mL Stopped (02/20/20 1015)      LOS: 7 days   02/22/20  [facility]  02/21/2020, 7:27 AM

## 2020-02-21 NOTE — Plan of Care (Signed)

## 2020-02-22 DIAGNOSIS — J9601 Acute respiratory failure with hypoxia: Secondary | ICD-10-CM | POA: Diagnosis not present

## 2020-02-22 DIAGNOSIS — R7989 Other specified abnormal findings of blood chemistry: Secondary | ICD-10-CM | POA: Diagnosis not present

## 2020-02-22 DIAGNOSIS — U071 COVID-19: Secondary | ICD-10-CM | POA: Diagnosis not present

## 2020-02-22 DIAGNOSIS — I160 Hypertensive urgency: Secondary | ICD-10-CM | POA: Diagnosis not present

## 2020-02-22 LAB — D-DIMER, QUANTITATIVE: D-Dimer, Quant: 2 ug/mL-FEU — ABNORMAL HIGH (ref 0.00–0.50)

## 2020-02-22 MED ORDER — SALINE SPRAY 0.65 % NA SOLN
1.0000 | NASAL | Status: DC | PRN
  Filled 2020-02-22: qty 44

## 2020-02-22 MED ORDER — POLYETHYLENE GLYCOL 3350 17 G PO PACK
17.0000 g | PACK | Freq: Every day | ORAL | Status: AC
  Administered 2020-02-22: 17 g via ORAL
  Filled 2020-02-22 (×3): qty 1

## 2020-02-22 MED ORDER — FUROSEMIDE 10 MG/ML IJ SOLN
40.0000 mg | Freq: Every day | INTRAMUSCULAR | Status: DC
  Administered 2020-02-22: 40 mg via INTRAVENOUS
  Filled 2020-02-22: qty 4

## 2020-02-22 NOTE — Progress Notes (Signed)
Pt has been weaned to 2L [place] and maintained O2 sat of 92-94%. All questions addressed for patient and pt is hopeful for discharge this weekend. RN provided more education on the importance of proning and using incentive spirometer. No further needs.

## 2020-02-22 NOTE — Plan of Care (Signed)
  Problem: Clinical Measurements: Goal: Ability to maintain clinical measurements within normal limits will improve Outcome: Progressing Goal: Will remain free from infection Outcome: Progressing Goal: Diagnostic test results will improve Outcome: Progressing Goal: Respiratory complications will improve Outcome: Progressing Goal: Cardiovascular complication will be avoided Outcome: Progressing   Problem: Activity: Goal: Risk for activity intolerance will decrease Outcome: Progressing   Problem: Nutrition: Goal: Adequate nutrition will be maintained Outcome: Progressing   Problem: Coping: Goal: Level of anxiety will decrease Outcome: Progressing   Problem: Elimination: Goal: Will not experience complications related to bowel motility Outcome: Progressing Goal: Will not experience complications related to urinary retention Outcome: Progressing   Problem: Skin Integrity: Goal: Risk for impaired skin integrity will decrease Outcome: Progressing   

## 2020-02-22 NOTE — Plan of Care (Signed)

## 2020-02-22 NOTE — Progress Notes (Signed)
[facility] PROGRESS NOTE    Progress Note  Isaac Cochran  NLG:[id number] DOB: 1986/03/02 DOA: 02/14/2020 PCP: Pcp, No     Brief Narrative:   Isaac Cochran is an 34 y.o. male unvaccinated for COVID-19 who presented to the urgent care for 2-week of dyspnea and cough was found to be satting 50% on room air placed on a nonrebreather tested positive for SARS-CoV-2 was transfer to [facility] requiring 15 L of high flow nasal cannula  Assessment/Plan:   Acute hypoxemic respiratory failure due to COVID-19 and Bilateral multifocal pneumonia: This morning he is on 3 L of high flow nasal cannula to keep saturation greater 90%. He relates his breathing is significantly better. Check a D-dimer. Try to prone to keep prone for at least 16 hours a day if not prone out of bed to chair, continue to encourage incentive spirometry and flutter valve. I's and O's are poorly recorded, strict I's and O's restrict his fluid diet, will start him on IV Lasix he is retaining fluid.  Hypertensive urgency: Improved continue labetalol IV as needed.  Elevated LFTs Due to fatty liver now improved.  Morbid obesity  DVT prophylaxis: Lovenox Family Communication:none Status is: Inpatient  Remains inpatient appropriate because:Hemodynamically unstable   Dispo: The patient is from: Home              Anticipated d/c is to: Home              Anticipated d/c date is: 2 days              Patient currently is not medically stable to d/c.        Code Status:     Code Status Orders  (From admission, onward)         Start     Ordered   02/14/20 1832  Full code  Continuous        02/14/20 1831        Code Status History    This patient has a current code status but no historical code status.   Advance Care Planning Activity        IV Access:    Peripheral IV   Procedures and diagnostic studies:   No results found.   Medical Consultants:    None.  Anti-Infectives:     none  Subjective:    Isaac Cochran related appetite has returned.  Objective:    Vitals:   02/21/20 1614 02/21/20 2056 02/22/20 0429 02/22/20 0700  BP: 129/84 124/85 (!) 118/92   Pulse: 98 87 88   Resp: 18 17 17    Temp: 98.1 F (36.7 C) 98.1 F (36.7 C) 98 F (36.7 C)   TempSrc: Oral Oral Oral   SpO2: 91% 92% 97% 96%  Weight:      Height:       SpO2: 96 % O2 Flow Rate (L/min): 3 L/min FiO2 (%): 100 %   Intake/Output Summary (Last 24 hours) at 02/22/2020 0834 Last data filed at 02/22/2020 0443 Gross per 24 hour  Intake 1120 ml  Output 1300 ml  Net -180 ml   Filed Weights   02/15/20 0005  Weight: 135.2 kg    Exam: General exam: In no acute distress. Respiratory system: Good air movement and clear to auscultation. Cardiovascular system: S1 & S2 heard, RRR. No JVD. Gastrointestinal system: Abdomen is nondistended, soft and nontender.  Extremities: Trace edema Skin: No rashes, lesions or ulcers Psychiatry: Judgement and insight appear normal.  Mood & affect appropriate.   Data Reviewed:    Labs: Basic Metabolic Panel: Recent Labs  Lab 02/16/20 0444 02/16/20 0444 02/17/20 0537 02/17/20 0537 02/18/20 0502 02/18/20 0502 02/19/20 0425 02/20/20 0502  NA 143  --  142  --  142  --  138 134*  K 4.4   < > 4.7   < > 4.9   < > 4.7 5.0  CL 107  --  106  --  106  --  104 103  CO2 25  --  26  --  23  --  23 21*  GLUCOSE 134*  --  129*  --  116*  --  131* 133*  BUN 25*  --  27*  --  26*  --  26* 27*  CREATININE 0.80  --  0.89  --  0.86  --  0.82 0.57*  CALCIUM 8.8*  --  8.7*  --  8.5*  --  8.6* 8.8*   < > = values in this interval not displayed.   GFR Estimated Creatinine Clearance: 168.4 mL/min (A) (by C-G formula based on SCr of 0.57 mg/dL (L)). Liver Function Tests: Recent Labs  Lab 02/16/20 0444 02/17/20 0537 02/18/20 0502 02/19/20 0425 02/20/20 0502  AST 59* 56* 67* 67* 52*  ALT 52* 54* 70* 84* 92*  ALKPHOS 60 69 77 70 82  BILITOT 0.4  0.6 0.7 1.1 1.0  PROT 8.0 8.0 7.9 7.8 8.3*  ALBUMIN 3.4* 3.5 3.6 3.5 3.8   No results for input(s): LIPASE, AMYLASE in the last 168 hours. No results for input(s): AMMONIA in the last 168 hours. Coagulation profile No results for input(s): INR, PROTIME in the last 168 hours. COVID-19 Labs  Recent Labs    02/20/20 0502  DDIMER 3.14*    Lab Results  Component Value Date   SARSCOV2NAA POSITIVE (A) 02/14/2020    CBC: Recent Labs  Lab 02/16/20 0444 02/17/20 0537 02/18/20 0502 02/19/20 0425  WBC 12.9* 14.4* 17.6* 15.8*  NEUTROABS 11.8* 12.4* 15.4* 14.4*  HGB 14.0 13.9 14.4 14.6  HCT 44.8 45.6 46.3 46.6  MCV 88.9 91.6 89.0 88.1  PLT 247 275 305 359   Cardiac Enzymes: No results for input(s): CKTOTAL, CKMB, CKMBINDEX, TROPONINI in the last 168 hours. BNP (last 3 results) No results for input(s): PROBNP in the last 8760 hours. CBG: Recent Labs  Lab 02/17/20 1714  GLUCAP 112*   D-Dimer: Recent Labs    02/20/20 0502  DDIMER 3.14*   Hgb A1c: No results for input(s): HGBA1C in the last 72 hours. Lipid Profile: No results for input(s): CHOL, HDL, LDLCALC, TRIG, CHOLHDL, LDLDIRECT in the last 72 hours. Thyroid function studies: No results for input(s): TSH, T4TOTAL, T3FREE, THYROIDAB in the last 72 hours.  Invalid input(s): FREET3 Anemia work up: No results for input(s): VITAMINB12, FOLATE, FERRITIN, TIBC, IRON, RETICCTPCT in the last 72 hours. Sepsis Labs: Recent Labs  Lab 02/16/20 0444 02/17/20 0537 02/18/20 0502 02/19/20 0425  WBC 12.9* 14.4* 17.6* 15.8*   Microbiology Recent Results (from the past 240 hour(s))  Respiratory Panel by RT PCR (Flu A&B, Covid) - Nasopharyngeal Swab     Status: Abnormal   Collection Time: 02/14/20 11:41 AM   Specimen: Nasopharyngeal Swab  Result Value Ref Range Status   SARS Coronavirus 2 by RT PCR POSITIVE (A) NEGATIVE Final    Comment: RESULT CALLED TO, READ BACK BY AND VERIFIED WITH: WEST,S. RN @1335  02/14/20  BILLINGSLEY,L (NOTE) SARS-CoV-2 target nucleic acids are DETECTED.  SARS-CoV-2 RNA is generally detectable in upper respiratory specimens  during the acute phase of infection. Positive results are indicative of the presence of the identified virus, but do not rule out bacterial infection or co-infection with other pathogens not detected by the test. Clinical correlation with patient history and other diagnostic information is necessary to determine patient infection status. The expected result is Negative.  Fact Sheet for Patients:  https://www.moore.com/  Fact Sheet for Healthcare Providers: https://www.young.biz/  This test is not yet approved or cleared by the [place] FDA and  has been authorized for detection and/or diagnosis of SARS-CoV-2 by FDA under an Emergency Use Authorization (EUA).  This EUA will remain in effect (meaning this test ca n be used) for the duration of  the COVID-19 declaration under Section 564(b)(1) of the Act, 21 U.S.C. section 360bbb-3(b)(1), unless the authorization is terminated or revoked sooner.      Influenza A by PCR NEGATIVE NEGATIVE Final   Influenza B by PCR NEGATIVE NEGATIVE Final    Comment: (NOTE) The Xpert Xpress SARS-CoV-2/FLU/RSV assay is intended as an aid in  the diagnosis of influenza from Nasopharyngeal swab specimens and  should not be used as a sole basis for treatment. Nasal washings and  aspirates are unacceptable for Xpert Xpress SARS-CoV-2/FLU/RSV  testing.  Fact Sheet for Patients: https://www.moore.com/  Fact Sheet for Healthcare Providers: https://www.young.biz/  This test is not yet approved or cleared by the [place] FDA and  has been authorized for detection and/or diagnosis of SARS-CoV-2 by  FDA under an Emergency Use Authorization (EUA). This EUA will remain  in effect (meaning this test can be used) for the duration of the   Covid-19 declaration under Section 564(b)(1) of the Act, 21  U.S.C. section 360bbb-3(b)(1), unless the authorization is  terminated or revoked. Performed at [facility], [address] [place], [place] [address]      Medications:   . baricitinib  4 mg Oral Daily  . enoxaparin (LOVENOX) injection  65 mg Subcutaneous Q12H  . methylPREDNISolone (SOLU-MEDROL) injection  80 mg Intravenous BID  . sodium chloride flush  3 mL Intravenous Q12H  . traZODone  100 mg Oral QHS   Continuous Infusions:     LOS: 8 days   Marinda Elk  [facility]  02/22/2020, 8:34 AM

## 2020-02-23 DIAGNOSIS — R7989 Other specified abnormal findings of blood chemistry: Secondary | ICD-10-CM | POA: Diagnosis not present

## 2020-02-23 DIAGNOSIS — J9601 Acute respiratory failure with hypoxia: Secondary | ICD-10-CM | POA: Diagnosis not present

## 2020-02-23 DIAGNOSIS — U071 COVID-19: Secondary | ICD-10-CM | POA: Diagnosis not present

## 2020-02-23 DIAGNOSIS — I160 Hypertensive urgency: Secondary | ICD-10-CM | POA: Diagnosis not present

## 2020-02-23 LAB — BASIC METABOLIC PANEL
Anion gap: 11 (ref 5–15)
BUN: 32 mg/dL — ABNORMAL HIGH (ref 6–20)
CO2: 21 mmol/L — ABNORMAL LOW (ref 22–32)
Calcium: 9 mg/dL (ref 8.9–10.3)
Chloride: 98 mmol/L (ref 98–111)
Creatinine, Ser: 0.66 mg/dL (ref 0.61–1.24)
GFR, Estimated: >60 mL/min (ref >60)
Glucose, Bld: 130 mg/dL — ABNORMAL HIGH (ref 70–99)
Potassium: 4.7 mmol/L (ref 3.5–5.1)
Sodium: 130 mmol/L — ABNORMAL LOW (ref 135–145)

## 2020-02-23 LAB — D-DIMER, QUANTITATIVE: D-Dimer, Quant: 1.45 ug/mL-FEU — ABNORMAL HIGH (ref 0.00–0.50)

## 2020-02-23 MED ORDER — METHYLPREDNISOLONE SODIUM SUCC 40 MG IJ SOLR
20.0000 mg | Freq: Two times a day (BID) | INTRAMUSCULAR | Status: DC
  Administered 2020-02-23 (×2): 20 mg via INTRAVENOUS
  Filled 2020-02-23 (×2): qty 1

## 2020-02-23 MED ORDER — FUROSEMIDE 10 MG/ML IJ SOLN
40.0000 mg | Freq: Every day | INTRAMUSCULAR | Status: DC
  Administered 2020-02-23 – 2020-02-25 (×3): 40 mg via INTRAVENOUS
  Filled 2020-02-23 (×3): qty 4

## 2020-02-23 MED ORDER — METHYLPREDNISOLONE SODIUM SUCC 125 MG IJ SOLR: 40.0000 mg | Freq: Two times a day (BID) | INTRAMUSCULAR | Status: DC

## 2020-02-23 NOTE — Progress Notes (Signed)
I responded to a consult by contacting the pt by phone. He was glad to hear from me. He told about he has been through in the last 2 weeks. Things looked dark but are looking up now. I quoted some scriptures to him and told him that I would get a bible to him. The chaplain offered caring and supportive presence, prayers and blessings. Further visits will be offered.

## 2020-02-23 NOTE — Progress Notes (Signed)
[facility] PROGRESS NOTE    Progress Note  Isaac Cochran  ZOX:[id number] DOB: 08/30/1985 DOA: 02/14/2020 PCP: Pcp, No     Brief Narrative:   Isaac Cochran is an 34 y.o. male unvaccinated for COVID-19 who presented to the urgent care for 2-week of dyspnea and cough was found to be satting 50% on room air placed on a nonrebreather tested positive for SARS-CoV-2 was transfer to [facility] requiring 15 L of high flow nasal cannula  Assessment/Plan:   Acute hypoxemic respiratory failure due to COVID-19 and Bilateral multifocal pneumonia: This morning is on 2 L oxygen to keep saturations greater than 90%. Will continue to titrate steroids down, he has completed his course of IV remdesivir will continue baricitinib. Needs to have strict I's and O's and daily weights, we also need to restrict his fluids, he is negative about 4 L. Continue IV Lasix. Fluid needs to be restricted and continue strict I's and O's.  Hypertensive urgency: Improved continue labetalol IV as needed.  Elevated LFTs Due to fatty liver now improved.  Morbid obesity  DVT prophylaxis: Lovenox Family Communication:none Status is: Inpatient  Remains inpatient appropriate because:Hemodynamically unstable   Dispo: The patient is from: Home              Anticipated d/c is to: Home              Anticipated d/c date is: 2 days              Patient currently is not medically stable to d/c.        Code Status:     Code Status Orders  (From admission, onward)         Start     Ordered   02/14/20 1832  Full code  Continuous        02/14/20 1831        Code Status History    This patient has a current code status but no historical code status.   Advance Care Planning Activity        IV Access:    Peripheral IV   Procedures and diagnostic studies:   No results found.   Medical Consultants:    None.  Anti-Infectives:   none  Subjective:    Isaac Cochran no new  complaints this morning.  Objective:    Vitals:   02/22/20 1213 02/22/20 1222 02/22/20 2113 02/23/20 0504  BP: 106/84  96/75 108/83  Pulse: (!) 113 (!) 108 88 95  Resp: 16  19 20   Temp: 98.2 F (36.8 C)  98 F (36.7 C) 98.2 F (36.8 C)  TempSrc: Oral  Oral Oral  SpO2: 93%  93% 97%  Weight:    122.9 kg  Height:       SpO2: 97 % O2 Flow Rate (L/min): 2 L/min FiO2 (%): 100 %   Intake/Output Summary (Last 24 hours) at 02/23/2020 0816 Last data filed at 02/22/2020 2130 Gross per 24 hour  Intake 723 ml  Output 1350 ml  Net -627 ml   Filed Weights   02/15/20 0005 02/23/20 0504  Weight: 135.2 kg 122.9 kg    Exam: General exam: In no acute distress. Respiratory system: Good air movement and clear to auscultation. Cardiovascular system: S1 & S2 heard, RRR. No JVD. Gastrointestinal system: Abdomen is nondistended, soft and nontender.  Extremities: No pedal edema. Skin: No rashes, lesions or ulcers  Data Reviewed:    Labs: Basic Metabolic Panel: Recent Labs  Lab 02/17/20 0537 02/17/20 0537 02/18/20 0502 02/18/20 0502 02/19/20 0425 02/19/20 0425 02/20/20 0502 02/23/20 0542  NA 142  --  142  --  138  --  134* 130*  K 4.7   < > 4.9   < > 4.7   < > 5.0 4.7  CL 106  --  106  --  104  --  103 98  CO2 26  --  23  --  23  --  21* 21*  GLUCOSE 129*  --  116*  --  131*  --  133* 130*  BUN 27*  --  26*  --  26*  --  27* 32*  CREATININE 0.89  --  0.86  --  0.82  --  0.57* 0.66  CALCIUM 8.7*  --  8.5*  --  8.6*  --  8.8* 9.0   < > = values in this interval not displayed.   GFR Estimated Creatinine Clearance: 159.3 mL/min (by C-G formula based on SCr of 0.66 mg/dL). Liver Function Tests: Recent Labs  Lab 02/17/20 0537 02/18/20 0502 02/19/20 0425 02/20/20 0502  AST 56* 67* 67* 52*  ALT 54* 70* 84* 92*  ALKPHOS 69 77 70 82  BILITOT 0.6 0.7 1.1 1.0  PROT 8.0 7.9 7.8 8.3*  ALBUMIN 3.5 3.6 3.5 3.8   No results for input(s): LIPASE, AMYLASE in the last 168  hours. No results for input(s): AMMONIA in the last 168 hours. Coagulation profile No results for input(s): INR, PROTIME in the last 168 hours. COVID-19 Labs  Recent Labs    02/22/20 0859 02/23/20 0542  DDIMER 2.00* 1.45*    Lab Results  Component Value Date   SARSCOV2NAA POSITIVE (A) 02/14/2020    CBC: Recent Labs  Lab 02/17/20 0537 02/18/20 0502 02/19/20 0425  WBC 14.4* 17.6* 15.8*  NEUTROABS 12.4* 15.4* 14.4*  HGB 13.9 14.4 14.6  HCT 45.6 46.3 46.6  MCV 91.6 89.0 88.1  PLT 275 305 359   Cardiac Enzymes: No results for input(s): CKTOTAL, CKMB, CKMBINDEX, TROPONINI in the last 168 hours. BNP (last 3 results) No results for input(s): PROBNP in the last 8760 hours. CBG: Recent Labs  Lab 02/17/20 1714  GLUCAP 112*   D-Dimer: Recent Labs    02/22/20 0859 02/23/20 0542  DDIMER 2.00* 1.45*   Hgb A1c: No results for input(s): HGBA1C in the last 72 hours. Lipid Profile: No results for input(s): CHOL, HDL, LDLCALC, TRIG, CHOLHDL, LDLDIRECT in the last 72 hours. Thyroid function studies: No results for input(s): TSH, T4TOTAL, T3FREE, THYROIDAB in the last 72 hours.  Invalid input(s): FREET3 Anemia work up: No results for input(s): VITAMINB12, FOLATE, FERRITIN, TIBC, IRON, RETICCTPCT in the last 72 hours. Sepsis Labs: Recent Labs  Lab 02/17/20 0537 02/18/20 0502 02/19/20 0425  WBC 14.4* 17.6* 15.8*   Microbiology Recent Results (from the past 240 hour(s))  Respiratory Panel by RT PCR (Flu A&B, Covid) - Nasopharyngeal Swab     Status: Abnormal   Collection Time: 02/14/20 11:41 AM   Specimen: Nasopharyngeal Swab  Result Value Ref Range Status   SARS Coronavirus 2 by RT PCR POSITIVE (A) NEGATIVE Final    Comment: RESULT CALLED TO, READ BACK BY AND VERIFIED WITH: WEST,S. RN @1335  02/14/20 BILLINGSLEY,L (NOTE) SARS-CoV-2 target nucleic acids are DETECTED.  SARS-CoV-2 RNA is generally detectable in upper respiratory specimens  during the acute phase of  infection. Positive results are indicative of the presence of the identified virus, but do not rule out bacterial  infection or co-infection with other pathogens not detected by the test. Clinical correlation with patient history and other diagnostic information is necessary to determine patient infection status. The expected result is Negative.  Fact Sheet for Patients:  https://www.moore.com/  Fact Sheet for Healthcare Providers: https://www.young.biz/  This test is not yet approved or cleared by the [place] FDA and  has been authorized for detection and/or diagnosis of SARS-CoV-2 by FDA under an Emergency Use Authorization (EUA).  This EUA will remain in effect (meaning this test ca n be used) for the duration of  the COVID-19 declaration under Section 564(b)(1) of the Act, 21 U.S.C. section 360bbb-3(b)(1), unless the authorization is terminated or revoked sooner.      Influenza A by PCR NEGATIVE NEGATIVE Final   Influenza B by PCR NEGATIVE NEGATIVE Final    Comment: (NOTE) The Xpert Xpress SARS-CoV-2/FLU/RSV assay is intended as an aid in  the diagnosis of influenza from Nasopharyngeal swab specimens and  should not be used as a sole basis for treatment. Nasal washings and  aspirates are unacceptable for Xpert Xpress SARS-CoV-2/FLU/RSV  testing.  Fact Sheet for Patients: https://www.moore.com/  Fact Sheet for Healthcare Providers: https://www.young.biz/  This test is not yet approved or cleared by the [place] FDA and  has been authorized for detection and/or diagnosis of SARS-CoV-2 by  FDA under an Emergency Use Authorization (EUA). This EUA will remain  in effect (meaning this test can be used) for the duration of the  Covid-19 declaration under Section 564(b)(1) of the Act, 21  U.S.C. section 360bbb-3(b)(1), unless the authorization is  terminated or revoked. Performed at [facility], [address] [place], [place] [address]      Medications:   . baricitinib  4 mg Oral Daily  . enoxaparin (LOVENOX) injection  65 mg Subcutaneous Q12H  . furosemide  40 mg Intravenous Daily  . methylPREDNISolone (SOLU-MEDROL) injection  80 mg Intravenous BID  . polyethylene glycol  17 g Oral Daily  . sodium chloride flush  3 mL Intravenous Q12H  . traZODone  100 mg Oral QHS   Continuous Infusions:     LOS: 9 days   Marinda Elk  [facility]  02/23/2020, 8:16 AM

## 2020-02-24 DIAGNOSIS — U071 COVID-19: Secondary | ICD-10-CM | POA: Diagnosis not present

## 2020-02-24 DIAGNOSIS — I160 Hypertensive urgency: Secondary | ICD-10-CM | POA: Diagnosis not present

## 2020-02-24 DIAGNOSIS — R7989 Other specified abnormal findings of blood chemistry: Secondary | ICD-10-CM | POA: Diagnosis not present

## 2020-02-24 DIAGNOSIS — J9601 Acute respiratory failure with hypoxia: Secondary | ICD-10-CM | POA: Diagnosis not present

## 2020-02-24 LAB — BASIC METABOLIC PANEL
Anion gap: 14 (ref 5–15)
BUN: 35 mg/dL — ABNORMAL HIGH (ref 6–20)
CO2: 23 mmol/L (ref 22–32)
Calcium: 8.7 mg/dL — ABNORMAL LOW (ref 8.9–10.3)
Chloride: 96 mmol/L — ABNORMAL LOW (ref 98–111)
Creatinine, Ser: 0.84 mg/dL (ref 0.61–1.24)
GFR, Estimated: >60 mL/min (ref >60)
Glucose, Bld: 127 mg/dL — ABNORMAL HIGH (ref 70–99)
Potassium: 4.7 mmol/L (ref 3.5–5.1)
Sodium: 133 mmol/L — ABNORMAL LOW (ref 135–145)

## 2020-02-24 LAB — D-DIMER, QUANTITATIVE: D-Dimer, Quant: 1.19 ug/mL-FEU — ABNORMAL HIGH (ref 0.00–0.50)

## 2020-02-24 MED ORDER — METHYLPREDNISOLONE SODIUM SUCC 40 MG IJ SOLR
20.0000 mg | Freq: Every day | INTRAMUSCULAR | Status: DC
  Administered 2020-02-24 – 2020-02-25 (×2): 20 mg via INTRAVENOUS

## 2020-02-24 NOTE — Progress Notes (Signed)
[facility] PROGRESS NOTE    Progress Note  Isaac Cochran  OVF:[id number] DOB: April 15, 1985 DOA: 02/14/2020 PCP: Pcp, No     Brief Narrative:   Isaac Cochran is an 34 y.o. male unvaccinated for COVID-19 who presented to the urgent care for 2-week of dyspnea and cough was found to be satting 50% on room air placed on a nonrebreather tested positive for SARS-CoV-2 was transfer to [facility] requiring 15 L of high flow nasal cannula  Assessment/Plan:   Acute hypoxemic respiratory failure due to COVID-19 and Bilateral multifocal pneumonia: He is still requiring 2 L oxygen keep saturations greater than 90% he looks comfortable and his saturations this morning are 96% try to wean to room air. Has completed his course of remdesivir and IV steroids his last day of steroids will be today. Continue baricitinib. Need to follow strict I's and O's and daily weights. Started on IV Lasix he is negative about 5.5 L, his sodium is improving creatinine is stable  Hypertensive urgency: Blood pressure is well controlled continue current regimen. He is only on Lasix.  Elevated LFTs Due to fatty liver now improved.  Morbid obesity  DVT prophylaxis: Lovenox Family Communication:none Status is: Inpatient  Remains inpatient appropriate because:Hemodynamically unstable   Dispo: The patient is from: Home              Anticipated d/c is to: Home              Anticipated d/c date is: 2 days              Patient currently is not medically stable to d/c.        Code Status:     Code Status Orders  (From admission, onward)         Start     Ordered   02/14/20 1832  Full code  Continuous        02/14/20 1831        Code Status History    This patient has a current code status but no historical code status.   Advance Care Planning Activity        IV Access:    Peripheral IV   Procedures and diagnostic studies:   No results found.   Medical Consultants:     None.  Anti-Infectives:   none  Subjective:    Isaac Cochran relates no new complaints this morning.  Objective:    Vitals:   02/23/20 0504 02/23/20 1238 02/23/20 2047 02/24/20 0500  BP: 108/83 115/77 136/73 136/85  Pulse: 95 (!) 104 98 96  Resp: 20 18  18   Temp: 98.2 F (36.8 C) 97.6 F (36.4 C) 97.7 F (36.5 C) 97.8 F (36.6 C)  TempSrc: Oral Oral Oral Oral  SpO2: 97% 97% 95% 96%  Weight: 122.9 kg   122.7 kg  Height:       SpO2: 96 % O2 Flow Rate (L/min): 2 L/min FiO2 (%): 100 %   Intake/Output Summary (Last 24 hours) at 02/24/2020 0811 Last data filed at 02/24/2020 0500 Gross per 24 hour  Intake 1160 ml  Output 1950 ml  Net -790 ml   Filed Weights   02/15/20 0005 02/23/20 0504 02/24/20 0500  Weight: 135.2 kg 122.9 kg 122.7 kg    Exam: General exam: In no acute distress. Respiratory system: Good air movement and clear to auscultation. Cardiovascular system: S1 & S2 heard, RRR. No JVD. Gastrointestinal system: Abdomen is nondistended, soft and nontender.  Extremities: No pedal edema. Skin: No rashes, lesions or ulcers  Data Reviewed:    Labs: Basic Metabolic Panel: Recent Labs  Lab 02/18/20 0502 02/18/20 0502 02/19/20 0425 02/19/20 0425 02/20/20 0502 02/20/20 0502 02/23/20 0542 02/24/20 0523  NA 142  --  138  --  134*  --  130* 133*  K 4.9   < > 4.7   < > 5.0   < > 4.7 4.7  CL 106  --  104  --  103  --  98 96*  CO2 23  --  23  --  21*  --  21* 23  GLUCOSE 116*  --  131*  --  133*  --  130* 127*  BUN 26*  --  26*  --  27*  --  32* 35*  CREATININE 0.86  --  0.82  --  0.57*  --  0.66 0.84  CALCIUM 8.5*  --  8.6*  --  8.8*  --  9.0 8.7*   < > = values in this interval not displayed.   GFR Estimated Creatinine Clearance: 151.7 mL/min (by C-G formula based on SCr of 0.84 mg/dL). Liver Function Tests: Recent Labs  Lab 02/18/20 0502 02/19/20 0425 02/20/20 0502  AST 67* 67* 52*  ALT 70* 84* 92*  ALKPHOS 77 70 82  BILITOT 0.7  1.1 1.0  PROT 7.9 7.8 8.3*  ALBUMIN 3.6 3.5 3.8   No results for input(s): LIPASE, AMYLASE in the last 168 hours. No results for input(s): AMMONIA in the last 168 hours. Coagulation profile No results for input(s): INR, PROTIME in the last 168 hours. COVID-19 Labs  Recent Labs    02/22/20 0859 02/23/20 0542 02/24/20 0523  DDIMER 2.00* 1.45* 1.19*    Lab Results  Component Value Date   SARSCOV2NAA POSITIVE (A) 02/14/2020    CBC: Recent Labs  Lab 02/18/20 0502 02/19/20 0425  WBC 17.6* 15.8*  NEUTROABS 15.4* 14.4*  HGB 14.4 14.6  HCT 46.3 46.6  MCV 89.0 88.1  PLT 305 359   Cardiac Enzymes: No results for input(s): CKTOTAL, CKMB, CKMBINDEX, TROPONINI in the last 168 hours. BNP (last 3 results) No results for input(s): PROBNP in the last 8760 hours. CBG: Recent Labs  Lab 02/17/20 1714  GLUCAP 112*   D-Dimer: Recent Labs    02/23/20 0542 02/24/20 0523  DDIMER 1.45* 1.19*   Hgb A1c: No results for input(s): HGBA1C in the last 72 hours. Lipid Profile: No results for input(s): CHOL, HDL, LDLCALC, TRIG, CHOLHDL, LDLDIRECT in the last 72 hours. Thyroid function studies: No results for input(s): TSH, T4TOTAL, T3FREE, THYROIDAB in the last 72 hours.  Invalid input(s): FREET3 Anemia work up: No results for input(s): VITAMINB12, FOLATE, FERRITIN, TIBC, IRON, RETICCTPCT in the last 72 hours. Sepsis Labs: Recent Labs  Lab 02/18/20 0502 02/19/20 0425  WBC 17.6* 15.8*   Microbiology Recent Results (from the past 240 hour(s))  Respiratory Panel by RT PCR (Flu A&B, Covid) - Nasopharyngeal Swab     Status: Abnormal   Collection Time: 02/14/20 11:41 AM   Specimen: Nasopharyngeal Swab  Result Value Ref Range Status   SARS Coronavirus 2 by RT PCR POSITIVE (A) NEGATIVE Final    Comment: RESULT CALLED TO, READ BACK BY AND VERIFIED WITH: WEST,S. RN @1335  02/14/20 BILLINGSLEY,L (NOTE) SARS-CoV-2 target nucleic acids are DETECTED.  SARS-CoV-2 RNA is generally  detectable in upper respiratory specimens  during the acute phase of infection. Positive results are indicative of the presence of the identified  virus, but do not rule out bacterial infection or co-infection with other pathogens not detected by the test. Clinical correlation with patient history and other diagnostic information is necessary to determine patient infection status. The expected result is Negative.  Fact Sheet for Patients:  https://www.moore.com/  Fact Sheet for Healthcare Providers: https://www.young.biz/  This test is not yet approved or cleared by the [place] FDA and  has been authorized for detection and/or diagnosis of SARS-CoV-2 by FDA under an Emergency Use Authorization (EUA).  This EUA will remain in effect (meaning this test ca n be used) for the duration of  the COVID-19 declaration under Section 564(b)(1) of the Act, 21 U.S.C. section 360bbb-3(b)(1), unless the authorization is terminated or revoked sooner.      Influenza A by PCR NEGATIVE NEGATIVE Final   Influenza B by PCR NEGATIVE NEGATIVE Final    Comment: (NOTE) The Xpert Xpress SARS-CoV-2/FLU/RSV assay is intended as an aid in  the diagnosis of influenza from Nasopharyngeal swab specimens and  should not be used as a sole basis for treatment. Nasal washings and  aspirates are unacceptable for Xpert Xpress SARS-CoV-2/FLU/RSV  testing.  Fact Sheet for Patients: https://www.moore.com/  Fact Sheet for Healthcare Providers: https://www.young.biz/  This test is not yet approved or cleared by the [place] FDA and  has been authorized for detection and/or diagnosis of SARS-CoV-2 by  FDA under an Emergency Use Authorization (EUA). This EUA will remain  in effect (meaning this test can be used) for the duration of the  Covid-19 declaration under Section 564(b)(1) of the Act, 21  U.S.C. section 360bbb-3(b)(1),  unless the authorization is  terminated or revoked. Performed at [facility], [address] [place], [place] [address]      Medications:   . baricitinib  4 mg Oral Daily  . enoxaparin (LOVENOX) injection  65 mg Subcutaneous Q12H  . furosemide  40 mg Intravenous Daily  . methylPREDNISolone (SOLU-MEDROL) injection  20 mg Intravenous Daily  . polyethylene glycol  17 g Oral Daily  . sodium chloride flush  3 mL Intravenous Q12H  . traZODone  100 mg Oral QHS   Continuous Infusions:     LOS: 10 days   Marinda Elk  [facility]  02/24/2020, 8:11 AM

## 2020-02-24 NOTE — Plan of Care (Signed)
  Problem: Clinical Measurements: Goal: Ability to maintain clinical measurements within normal limits will improve Outcome: Progressing Goal: Will remain free from infection Outcome: Progressing Goal: Diagnostic test results will improve Outcome: Progressing Goal: Respiratory complications will improve Outcome: Progressing Goal: Cardiovascular complication will be avoided Outcome: Progressing   Problem: Activity: Goal: Risk for activity intolerance will decrease Outcome: Progressing   Problem: Nutrition: Goal: Adequate nutrition will be maintained Outcome: Progressing   Problem: Coping: Goal: Level of anxiety will decrease Outcome: Progressing   Problem: Pain Managment: Goal: General experience of comfort will improve Outcome: Progressing   Problem: Safety: Goal: Ability to remain free from injury will improve Outcome: Progressing   Problem: Skin Integrity: Goal: Risk for impaired skin integrity will decrease Outcome: Progressing   

## 2020-02-24 NOTE — Plan of Care (Signed)
  Problem: Education: Goal: Knowledge of General Education information will improve Description: Including pain rating scale, medication(s)/side effects and non-pharmacologic comfort measures Outcome: Progressing   Problem: Clinical Measurements: Goal: Ability to maintain clinical measurements within normal limits will improve Outcome: Progressing Goal: Will remain free from infection Outcome: Progressing Goal: Diagnostic test results will improve Outcome: Progressing Goal: Respiratory complications will improve Outcome: Progressing   Problem: Activity: Goal: Risk for activity intolerance will decrease Outcome: Progressing   Problem: Nutrition: Goal: Adequate nutrition will be maintained Outcome: Progressing   Problem: Coping: Goal: Level of anxiety will decrease Outcome: Progressing   Problem: Pain Managment: Goal: General experience of comfort will improve Outcome: Progressing   Problem: Safety: Goal: Ability to remain free from injury will improve Outcome: Progressing   Problem: Skin Integrity: Goal: Risk for impaired skin integrity will decrease Outcome: Progressing

## 2020-02-25 DIAGNOSIS — J189 Pneumonia, unspecified organism: Secondary | ICD-10-CM

## 2020-02-25 LAB — BASIC METABOLIC PANEL
Anion gap: 14 (ref 5–15)
BUN: 34 mg/dL — ABNORMAL HIGH (ref 6–20)
CO2: 22 mmol/L (ref 22–32)
Calcium: 8.7 mg/dL — ABNORMAL LOW (ref 8.9–10.3)
Chloride: 96 mmol/L — ABNORMAL LOW (ref 98–111)
Creatinine, Ser: 0.93 mg/dL (ref 0.61–1.24)
GFR, Estimated: >60 mL/min (ref >60)
Glucose, Bld: 100 mg/dL — ABNORMAL HIGH (ref 70–99)
Potassium: 4.1 mmol/L (ref 3.5–5.1)
Sodium: 132 mmol/L — ABNORMAL LOW (ref 135–145)

## 2020-02-25 NOTE — Progress Notes (Signed)
SATURATION QUALIFICATIONS: (This note is used to comply with regulatory documentation for home oxygen)  Patient Saturations on Room Air at Rest = 92%  Patient Saturations on Room Air while Ambulating = 91%    

## 2020-02-25 NOTE — Progress Notes (Addendum)
Patient discharging to home, continuous pulse ox removed, patient managing >90% while ambulating in room. IV removed from LAC, tip intact and site is WDL, a clean dressing applied to site. Discharge education provided to patient including instructions on MyChart  Covid 19 home monitoring program. Patient is resuming home medications and denies any questions or concerns concerning discharge instructions or medications. Discharge home with is wife, transported via wheelchair to private vehicle. Personal belonging with patient.   Work/Insurance papers filled out by Dr. Jarvis Newcomer returned to patients wife at time of discharge.

## 2020-02-25 NOTE — Discharge Summary (Signed)
Physician Discharge Summary  Isaac Cochran ZOX:[id number] DOB: 09/10/1985 DOA: 02/14/2020  PCP: Pcp, No  Admit date: 02/14/2020 Discharge date: 02/25/2020  Admitted From: Home Disposition: Home   Recommendations for Outpatient Follow-up:  1. Establish care with PCP in 1-2 weeks 2. Please obtain CMP/CBC at follow up.  Home Health: None Equipment/Devices: None Discharge Condition: Stable CODE STATUS: Full Diet recommendation: Regular  Brief/Interim Summary: Isaac HatcherRishawn L Relph is a 34 y.o. male with a history of obesity who was evaluated at urgent care for 2 weeks of dyspnea and cough found to have room air SpO2 in 50%'s improved with NRB prompting transfer to ED on 02/14/2020. SARS-CoV-2 Ag and PCR were positive, inflammatory markers elevated (CRP 5.5, PCT 0.24), and CXR demonstrated bilateral airspace infiltrates. CT revealed moderately severe diffuse GGOs and interstitial infiltrates without PE or lobar consolidation. He was admitted, started on remdesivir, steroids, baricitinib, and antibiotics. He continued to require 15L HFNC and NRB for many days, though ultimately sustained clinical improvement, no longer hypoxemic on day of discharge.  Discharge Diagnoses:  Principal Problem:   Acute hypoxemic respiratory failure due to COVID-19 [facility](HCC) Active Problems:   Hypertensive urgency   Multifocal pneumonia   Elevated LFTs   Obesity, Class III, BMI 40-49.9 (morbid obesity) (HCC)  Acute hypoxemic respiratory failure due to covid-19 pneumonia and superimposed CAP: SARS-CoV-2 PCR and Ag positive on 11/10. - Complete remdesivir, given baricitinib while admitted, completed full course of steroids with durable normalization of CRP, also given empiric antibiotics as well.  -Forms filled out with recommendation for 21 days isolation from positive test.  LFT elevations:  - Recheck at follow up  D-dimer elevation: In concert with rising oxygen requirements, though had negative CTA chest  initially. LE venous U/S without DVT and CTA chest repeated 11/12 and again negative for PE.  Hypertensive urgency: Improving. Normotensive without scheduled Tx. - PCP f/u  Insomnia: Trazodone effective while admitted.  Morbid obesity: Estimated body mass index is 43.66 kg/m as calculated from the following:   Height as of this encounter: 5\' 6"  (1.676 m).   Weight as of this encounter: 122.7 kg.  Discharge Instructions Discharge Instructions    Discharge instructions   Complete by: As directed    You are being discharged from the hospital after treatment for covid-19 infection. You are felt to be stable enough to no longer require inpatient monitoring, testing, and treatment, though you will need to follow the recommendations below: - Continue taking medications as you previously were. You have completed all covid-related treatments - Per CDC guidelines, you will need to remain in isolation for 21 days from your first positive covid test. Paperwork was completed to this effect, but further claims will need to be directed to your primary care doctor. - Follow up with your doctor in the next week via telehealth or seek medical attention right away if your symptoms get WORSE.  - You are still encouraged to get a covid vaccination between 21 days (after isolation period ends) and 90 days (before immunity is thought to wear off).  Directions for you at home:  Wear a facemask You should wear a facemask that covers your nose and mouth when you are in the same room with other people and when you visit a healthcare provider. People who live with or visit you should also wear a facemask while they are in the same room with you.  Separate yourself from other people in your home As much as possible, you should stay  in a different room from other people in your home. Also, you should use a separate bathroom, if available.  Avoid sharing household items You should not share dishes, drinking  glasses, cups, eating utensils, towels, bedding, or other items with other people in your home. After using these items, you should wash them thoroughly with soap and water.  Cover your coughs and sneezes Cover your mouth and nose with a tissue when you cough or sneeze, or you can cough or sneeze into your sleeve. Throw used tissues in a lined trash can, and immediately wash your hands with soap and water for at least 20 seconds or use an alcohol-based hand rub.  Wash your [employer] your hands often and thoroughly with soap and water for at least 20 seconds. You can use an alcohol-based hand sanitizer if soap and water are not available and if your hands are not visibly dirty. Avoid touching your eyes, nose, and mouth with unwashed hands.  Directions for those who live with, or provide care at home for you:  Limit the number of people who have contact with the patient If possible, have only one caregiver for the patient. Other household members should stay in another home or place of residence. If this is not possible, they should stay in another room, or be separated from the patient as much as possible. Use a separate bathroom, if available. Restrict visitors who do not have an essential need to be in the home.  Ensure good ventilation Make sure that shared spaces in the home have good air flow, such as from an air conditioner or an opened window, weather permitting.  Wash your hands often Wash your hands often and thoroughly with soap and water for at least 20 seconds. You can use an alcohol based hand sanitizer if soap and water are not available and if your hands are not visibly dirty. Avoid touching your eyes, nose, and mouth with unwashed hands. Use disposable paper towels to dry your hands. If not available, use dedicated cloth towels and replace them when they become wet.  Wear a facemask and gloves Wear a disposable facemask at all times in the room and gloves when you touch  or have contact with the patient's blood, body fluids, and/or secretions or excretions, such as sweat, saliva, sputum, nasal mucus, vomit, urine, or feces.  Ensure the mask fits over your nose and mouth tightly, and do not touch it during use. Throw out disposable facemasks and gloves after using them. Do not reuse. Wash your hands immediately after removing your facemask and gloves. If your personal clothing becomes contaminated, carefully remove clothing and launder. Wash your hands after handling contaminated clothing. Place all used disposable facemasks, gloves, and other waste in a lined container before disposing them with other household waste. Remove gloves and wash your hands immediately after handling these items.  Do not share dishes, glasses, or other household items with the patient Avoid sharing household items. You should not share dishes, drinking glasses, cups, eating utensils, towels, bedding, or other items with a patient who is confirmed to have, or being evaluated for, COVID-19 infection. After the person uses these items, you should wash them thoroughly with soap and water.  Wash laundry thoroughly Immediately remove and wash clothes or bedding that have blood, body fluids, and/or secretions or excretions, such as sweat, saliva, sputum, nasal mucus, vomit, urine, or feces, on them. Wear gloves when handling laundry from the patient. Read and follow directions on labels  of laundry or clothing items and detergent. In general, wash and dry with the warmest temperatures recommended on the label.  Clean all areas the individual has used often Clean all touchable surfaces, such as counters, tabletops, doorknobs, bathroom fixtures, toilets, phones, keyboards, tablets, and bedside tables, every day. Also, clean any surfaces that may have blood, body fluids, and/or secretions or excretions on them. Wear gloves when cleaning surfaces the patient has come in contact with. Use a diluted  bleach solution (e.g., dilute bleach with 1 part bleach and 10 parts water) or a household disinfectant with a label that says EPA-registered for coronaviruses. To make a bleach solution at home, add 1 tablespoon of bleach to 1 quart (4 cups) of water. For a larger supply, add  cup of bleach to 1 gallon (16 cups) of water. Read labels of cleaning products and follow recommendations provided on product labels. Labels contain instructions for safe and effective use of the cleaning product including precautions you should take when applying the product, such as wearing gloves or eye protection and making sure you have good ventilation during use of the product. Remove gloves and wash hands immediately after cleaning.  Monitor yourself for signs and symptoms of illness Caregivers and household members are considered close contacts, should monitor their health, and will be asked to limit movement outside of the home to the extent possible. Follow the monitoring steps for close contacts listed on the symptom monitoring form.  If you have additional questions, contact your local health department or call the epidemiologist on call at [phone] (available 24/7). This guidance is subject to change. For the most up-to-date guidance from [facility], please refer to their website: [email or web address]   MyChart COVID-19 home monitoring program   Complete by: Feb 25, 2020    Is the patient willing to use the MyChart Mobile App for home monitoring?: Yes     Allergies as of 02/25/2020      Reactions   Amoxicillin Hives   Amoxicillin-pot Clavulanate Hives   Penicillins Rash      Medication List    STOP taking these medications   HYDROcodone-acetaminophen 10-325 MG tablet Commonly known as: NORCO     TAKE these medications   acetaminophen 500 MG tablet Commonly known as: TYLENOL Take 500 mg by mouth every 6 (six) hours as needed for mild pain.    ELDERBERRY PO Take 1 capsule by mouth daily.   Fish Oil 1000 MG Caps Take 1,000 mg by mouth daily.   NyQuil HBP Cold & Flu 15-6.25-325 MG/15ML Liqd Generic drug: DM-Doxylamine-Acetaminophen Take 15 mLs by mouth every 4 (four) hours as needed (cold symptoms).   vitamin C 1000 MG tablet Take 1,000 mg by mouth daily.       Follow-up Information    [facility] Follow up.   Contact information: [address] [place] [address] [phone]             Allergies  Allergen Reactions  . Amoxicillin Hives  . Amoxicillin-Pot Clavulanate Hives  . Penicillins Rash    Consultations:  None  Procedures/Studies: CT ANGIO CHEST PE W OR WO CONTRAST  Result Date: 02/16/2020 CLINICAL DATA:  34 year old male with positive D-dimer. Concern for pulmonary embolism. Positive COVID-19. EXAM: CT ANGIOGRAPHY CHEST WITH CONTRAST TECHNIQUE: Multidetector CT imaging of the chest was performed using the standard protocol during bolus administration of intravenous contrast. Multiplanar CT image reconstructions and MIPs were obtained to evaluate the vascular anatomy. CONTRAST:   OMNIPAQUE IOHEXOL 350 MG/ML SOLN COMPARISON:  Chest CT dated 02/14/2020. FINDINGS: Evaluation of this exam is limited due to respiratory motion artifact. Cardiovascular: There is no cardiomegaly or pericardial effusion. The thoracic aorta is unremarkable. The origins of the great vessels of the aortic arch appear patent as visualized. Evaluation of the pulmonary arteries is very limited due to severe respiratory motion artifact. No definite large central pulmonary artery embolus identified. Mediastinum/Nodes: No obvious hilar adenopathy. Top-normal subcarinal lymph nodes, likely reactive. The esophagus and the thyroid gland are grossly unremarkable. No mediastinal fluid collection. Lungs/Pleura: Diffuse bilateral pulmonary opacities consistent with multifocal pneumonia and in keeping  with COVID-19. Small bilateral upper lobe pulmonary lacerations (26 and 30/6). There is no pleural effusion pneumothorax. The central airways are patent. Upper Abdomen: Fatty liver. Musculoskeletal: Degenerative changes of the spine. No acute osseous pathology. Review of the MIP images confirms the above findings. IMPRESSION: 1. No CT evidence of central pulmonary artery embolus. 2. Multifocal pneumonia and in keeping with COVID-19. 3. Small bilateral upper lobe pulmonary lacerations. No pneumothorax. 4. Fatty liver. Electronically Signed   By: Elgie Collard M.D.   On: 02/16/2020 18:25   CT ANGIO CHEST PE W OR WO CONTRAST  Result Date: 02/14/2020 CLINICAL DATA:  Shortness of breath for 2 weeks, low-grade fever, productive cough EXAM: CT ANGIOGRAPHY CHEST WITH CONTRAST TECHNIQUE: Multidetector CT imaging of the chest was performed using the standard protocol during bolus administration of intravenous contrast. Multiplanar CT image reconstructions and MIPs were obtained to evaluate the vascular anatomy. CONTRAST:  OMNIPAQUE IOHEXOL 350 MG/ML SOLN COMPARISON:  02/14/2020 FINDINGS: Cardiovascular: This is a technically adequate evaluation of the pulmonary vasculature. There are no filling defects or pulmonary emboli. The heart is unremarkable without pericardial effusion. Normal caliber of the thoracic aorta. Mediastinum/Nodes: Borderline enlarged mediastinal lymph nodes are likely reactive. Thyroid, trachea, and esophagus are unremarkable. Lungs/Pleura: Widespread multifocal bilateral airspace disease consistent with pneumonia given clinical presentation. Pattern is consistent with COVID-19 or other atypical infection. No effusion or pneumothorax. Central airways are patent. Upper Abdomen: Diffuse hepatic steatosis. No acute upper abdominal findings. Musculoskeletal: No acute or destructive bony lesions. Reconstructed images demonstrate no additional findings. Review of the MIP images confirms the above  findings. IMPRESSION: 1. No evidence of pulmonary embolus. 2. Widespread multifocal bilateral airspace disease, with an appearance most consistent with COVID-19 pneumonia. 3. Reactive mediastinal adenopathy. 4. Diffuse hepatic steatosis. Electronically Signed   By: Sharlet Salina M.D.   On: 02/14/2020 17:43   DG Chest Port 1 View  Result Date: 02/14/2020 CLINICAL DATA:  Hypoxia and shortness of breath EXAM: PORTABLE CHEST 1 VIEW COMPARISON:  None. FINDINGS: There is airspace opacity throughout the lungs bilaterally without appreciable consolidation. Heart is upper normal in size with pulmonary vascularity normal. No adenopathy. No bone lesions. IMPRESSION: Widespread airspace opacity bilaterally. Suspect multifocal pneumonia, likely of atypical organism etiology. A degree of pulmonary edema superimposed cannot be excluded. Heart upper normal in size. No adenopathy evident. Electronically Signed   By: Bretta Bang III M.D.   On: 02/14/2020 12:19   VAS [place] LOWER EXTREMITY VENOUS (DVT)  Result Date: 02/17/2020  Lower Venous DVT Study Indications: Elevated Ddimer.  Risk Factors: COVID 19 positive. Limitations: Body habitus, poor ultrasound/tissue interface and patient positioning. Comparison Study: No prior studies. Performing Technologist: Chanda Busing RVT  Examination Guidelines: A complete evaluation includes B-mode imaging, spectral Doppler, color Doppler, and power Doppler as needed of all accessible portions of each vessel. Bilateral testing is considered  an integral part of a complete examination. Limited examinations for reoccurring indications may be performed as noted. The reflux portion of the exam is performed with the patient in reverse Trendelenburg.  +---------+---------------+---------+-----------+----------+--------------+ RIGHT    CompressibilityPhasicitySpontaneityPropertiesThrombus Aging +---------+---------------+---------+-----------+----------+--------------+ CFV       Full           Yes      Yes                                 +---------+---------------+---------+-----------+----------+--------------+ SFJ      Full                                                        +---------+---------------+---------+-----------+----------+--------------+ FV Prox  Full                                                        +---------+---------------+---------+-----------+----------+--------------+ FV Mid   Full                                                        +---------+---------------+---------+-----------+----------+--------------+ FV DistalFull                                                        +---------+---------------+---------+-----------+----------+--------------+ PFV      Full                                                        +---------+---------------+---------+-----------+----------+--------------+ POP      Full           Yes      Yes                                 +---------+---------------+---------+-----------+----------+--------------+ PTV      Full                                                        +---------+---------------+---------+-----------+----------+--------------+ PERO     Full                                                        +---------+---------------+---------+-----------+----------+--------------+   +---------+---------------+---------+-----------+----------+-------------------+ LEFT     CompressibilityPhasicitySpontaneityPropertiesThrombus Aging      +---------+---------------+---------+-----------+----------+-------------------+  CFV      Full           Yes      Yes                                      +---------+---------------+---------+-----------+----------+-------------------+ SFJ      Full                                                             +---------+---------------+---------+-----------+----------+-------------------+ FV Prox  Full                                                              +---------+---------------+---------+-----------+----------+-------------------+ FV Mid   Full                                                             +---------+---------------+---------+-----------+----------+-------------------+ FV Distal               Yes      Yes                                      +---------+---------------+---------+-----------+----------+-------------------+ PFV      Full                                                             +---------+---------------+---------+-----------+----------+-------------------+ POP      Full           Yes      Yes                                      +---------+---------------+---------+-----------+----------+-------------------+ PTV      Full                                                             +---------+---------------+---------+-----------+----------+-------------------+ PERO                                                  Not well visualized +---------+---------------+---------+-----------+----------+-------------------+     Summary: RIGHT: - There is no evidence of deep vein thrombosis in the lower extremity. However, portions of this examination were  limited- see technologist comments above.  - No cystic structure found in the popliteal fossa.  LEFT: - There is no evidence of deep vein thrombosis in the lower extremity. However, portions of this examination were limited- see technologist comments above.  - No cystic structure found in the popliteal fossa.  *See table(s) above for measurements and observations. Electronically signed by Fabienne Bruns MD on 02/17/2020 at 11:49:24 AM.    Final       Subjective: Feels well, no dyspnea or chest pain, wants to go home.  Discharge Exam: Vitals:   02/25/20 0700 02/25/20 0800  BP:    Pulse:    Resp:    Temp:    SpO2: 95% 95%   General: Pt is alert, awake, not in acute  distress Cardiovascular: RRR, S1/S2 +, no rubs, no gallops Respiratory: CTA bilaterally, no wheezing, no rhonchi Abdominal: Soft, NT, ND, bowel sounds + Extremities: No edema, no cyanosis  Labs: BNP (last 3 results) No results for input(s): BNP in the last 8760 hours. Basic Metabolic Panel: Recent Labs  Lab 02/19/20 0425 02/20/20 0502 02/23/20 0542 02/24/20 0523 02/25/20 0427  NA 138 134* 130* 133* 132*  K 4.7 5.0 4.7 4.7 4.1  CL 104 103 98 96* 96*  CO2 23 21* 21* 23 22  GLUCOSE 131* 133* 130* 127* 100*  BUN 26* 27* 32* 35* 34*  CREATININE 0.82 0.57* 0.66 0.84 0.93  CALCIUM 8.6* 8.8* 9.0 8.7* 8.7*   Liver Function Tests: Recent Labs  Lab 02/19/20 0425 02/20/20 0502  AST 67* 52*  ALT 84* 92*  ALKPHOS 70 82  BILITOT 1.1 1.0  PROT 7.8 8.3*  ALBUMIN 3.5 3.8   No results for input(s): LIPASE, AMYLASE in the last 168 hours. No results for input(s): AMMONIA in the last 168 hours. CBC: Recent Labs  Lab 02/19/20 0425  WBC 15.8*  NEUTROABS 14.4*  HGB 14.6  HCT 46.6  MCV 88.1  PLT 359   Cardiac Enzymes: No results for input(s): CKTOTAL, CKMB, CKMBINDEX, TROPONINI in the last 168 hours. BNP: Invalid input(s): POCBNP CBG: No results for input(s): GLUCAP in the last 168 hours. D-Dimer Recent Labs    02/23/20 0542 02/24/20 0523  DDIMER 1.45* 1.19*   Hgb A1c No results for input(s): HGBA1C in the last 72 hours. Lipid Profile No results for input(s): CHOL, HDL, LDLCALC, TRIG, CHOLHDL, LDLDIRECT in the last 72 hours. Thyroid function studies No results for input(s): TSH, T4TOTAL, T3FREE, THYROIDAB in the last 72 hours.  Invalid input(s): FREET3 Anemia work up No results for input(s): VITAMINB12, FOLATE, FERRITIN, TIBC, IRON, RETICCTPCT in the last 72 hours. Urinalysis    Component Value Date/Time   COLORURINE YELLOW 06/01/2007 0606   APPEARANCEUR CLEAR 06/01/2007 0606   LABSPEC >1.030 (H) 06/01/2007 0606   PHURINE 6.0 06/01/2007 0606   GLUCOSEU NEGATIVE  06/01/2007 0606   HGBUR MODERATE (A) 06/01/2007 0606   BILIRUBINUR SMALL (A) 06/01/2007 0606   KETONESUR NEGATIVE 06/01/2007 0606   PROTEINUR NEGATIVE 06/01/2007 0606   UROBILINOGEN 0.2 06/01/2007 0606   NITRITE NEGATIVE 06/01/2007 0606   LEUKOCYTESUR NEGATIVE 06/01/2007 0606    Microbiology No results found for this or any previous visit (from the past 240 hour(s)).  Time coordinating discharge: Approximately 40 minutes  Tyrone Nine, MD  [facility] 02/25/2020, 10:09 AM

## 2020-04-08 ENCOUNTER — Encounter: Payer: Self-pay | Admitting: Pulmonary Disease

## 2020-04-08 ENCOUNTER — Other Ambulatory Visit: Payer: Self-pay

## 2020-04-08 ENCOUNTER — Ambulatory Visit (INDEPENDENT_AMBULATORY_CARE_PROVIDER_SITE_OTHER): Payer: BC Managed Care – PPO | Admitting: Pulmonary Disease

## 2020-04-08 VITALS — BP 122/82 | HR 96 | Temp 98.6°F | Ht 66.0 in | Wt 295.4 lb

## 2020-04-08 DIAGNOSIS — J1282 Pneumonia due to coronavirus disease 2019: Secondary | ICD-10-CM

## 2020-04-08 DIAGNOSIS — U071 COVID-19: Secondary | ICD-10-CM

## 2020-04-08 DIAGNOSIS — R0683 Snoring: Secondary | ICD-10-CM

## 2020-04-08 NOTE — Progress Notes (Addendum)
Synopsis: Referred in 04/2020 for post-covid follow up  Subjective:   PATIENT ID: Isaac Cochran, Isaac Cochran   HPI  Chief Complaint  Patient presents with   Consult    Recovering from covid--had double PNA---having issues with indigestion--acid reflux--SHOB with exertion.     Isaac Cochran is a 35 year old male, never smoker who is referred to pulmonary clinic for hospital follow up for Covid pneumonia and respiratory failure.   He was admitted 02/15/20 to 02/25/20 where he required high flow nasal canula and non-rebreather mask. He was treated with remdesivir, steroids, baricitinib and antibiotics. CTA chest did not indicate pulmonary emboli. The patient was discharged without supplemental oxygen and has been using albuterol and combivent inhaler as needed for shortness of breath. He reports some dyspnea on exertion but otherwise is feeling much better. He denies cough, chest tightness or wheezing.    He reports having a home sleep study performed in early December for concern of obstructive sleep apnea due to his obesity and history of snoring. He has not received these test results and they are not available in epic or care everywhere.   He denies history of smoking or vaping. No recreational drug use. 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    He has not received the covid 19 vaccine.  History reviewed. No pertinent past medical history.   History reviewed. No pertinent family history.   Social History   Socioeconomic History   Marital status: Single    Spouse name: Not on file   Number of children: Not on file   Years of education: Not on file   Highest education level: Not on file  Occupational History   Not on file  Tobacco Use   Smoking status: Never Smoker   Smokeless tobacco: Never Used  Vaping Use   Vaping Use: Never used  Substance and Sexual Activity   Alcohol use: No   Drug use:  No   Sexual activity: Not on file  Other Topics Concern   Not on file  Social History Narrative   Not on file   Social Determinants of Health   Financial Resource Strain: Not on file  Food Insecurity: Not on file  Transportation Needs: Not on file  Physical Activity: Not on file  Stress: Not on file  Social Connections: Not on file  Intimate Partner Violence: Not on file     Allergies  Allergen Reactions   Amoxicillin Hives   Amoxicillin-Pot Clavulanate Hives   Penicillins Rash     Outpatient Medications Prior to Visit  Medication Sig Dispense Refill   acetaminophen (TYLENOL) 500 MG tablet Take 500 mg by mouth every 6 (six) hours as needed for mild pain.     Ascorbic Acid (VITAMIN C) 1000 MG tablet Take 1,000 mg by mouth daily.     Cholecalciferol 125 MCG (5000 UT) TABS Vitamin D3 125 mcg (5,000 unit) tablet  Take 1 tablet every day by oral route as directed for 110 days.     ELDERBERRY PO Take 1 capsule by mouth daily.     Omega-3 Fatty Acids (FISH OIL) 1000 MG CAPS Take 1,000 mg by mouth daily.     DM-Doxylamine-Acetaminophen (NYQUIL HBP COLD & FLU) 15-6.25-325 MG/15ML LIQD Take 15 mLs by mouth every 4 (four) hours as needed (cold symptoms). (Patient not taking: Reported on 04/08/2020)     No facility-administered medications prior to visit.  Review of Systems  Constitutional: Negative for chills, fever, malaise/fatigue and weight loss.  HENT: Negative for congestion and sinus pain.   Eyes: Negative.   Respiratory: Positive for shortness of breath (on exertion). Negative for cough, hemoptysis, sputum production and wheezing.   Cardiovascular: Negative for chest pain, palpitations, orthopnea and leg swelling.  Gastrointestinal: Negative for abdominal pain, heartburn, nausea and vomiting.  Genitourinary: Negative.   Musculoskeletal: Negative for myalgias.  Skin: Negative for rash.  Neurological: Negative for dizziness, weakness and headaches.   Endo/Heme/Allergies: Does not bruise/bleed easily.  Psychiatric/Behavioral: Negative.     Objective:   Vitals:   04/08/20 0925  BP: 122/82  Pulse: 96  Temp: 98.6 F (37 C)  TempSrc: Tympanic  SpO2: 96%  Weight: 295 lb 6 oz (134 kg)  Height: 5\' 6"  (1.676 m)     Physical Exam Constitutional:      General: He is not in acute distress.    Appearance: He is obese. He is not ill-appearing.  HENT:     Head: Normocephalic and atraumatic.  Eyes:     General: No scleral icterus.    Conjunctiva/sclera: Conjunctivae normal.     Pupils: Pupils are equal, round, and reactive to light.  Cardiovascular:     Rate and Rhythm: Normal rate and regular rhythm.     Pulses: Normal pulses.     Heart sounds: Normal heart sounds. No murmur heard.   Pulmonary:     Effort: Pulmonary effort is normal.     Breath sounds: Normal breath sounds. No wheezing, rhonchi or rales.  Abdominal:     General: Bowel sounds are normal.     Palpations: Abdomen is soft.  Musculoskeletal:     Right lower leg: No edema.     Left lower leg: No edema.  Skin:    General: Skin is warm and dry.     Capillary Refill: Capillary refill takes less than 2 seconds.  Neurological:     General: No focal deficit present.     Mental Status: He is alert.  Psychiatric:        Mood and Affect: Mood normal.        Behavior: Behavior normal.        Thought Content: Thought content normal.        Judgment: Judgment normal.     CBC    Component Value Date/Time   WBC 15.8 (H) 02/19/2020 0425   RBC 5.29 02/19/2020 0425   HGB 14.6 02/19/2020 0425   HCT 46.6 02/19/2020 0425   PLT 359 02/19/2020 0425   MCV 88.1 02/19/2020 0425   MCH 27.6 02/19/2020 0425   MCHC 31.3 02/19/2020 0425   RDW 13.5 02/19/2020 0425   LYMPHSABS 0.5 (L) 02/19/2020 0425   MONOABS 0.5 02/19/2020 0425   EOSABS 0.0 02/19/2020 0425   BASOSABS 0.0 02/19/2020 0425   BMP Latest Ref Rng & Units 02/25/2020 02/24/2020 02/23/2020  Glucose 70 - 99  mg/dL 02/25/2020) 810(F) 751(W)  BUN 6 - 20 mg/dL 258(N) 27(P) 82(U)  Creatinine 0.61 - 1.24 mg/dL 23(N 3.61 4.43  Sodium 135 - 145 mmol/L 132(L) 133(L) 130(L)  Potassium 3.5 - 5.1 mmol/L 4.1 4.7 4.7  Chloride 98 - 111 mmol/L 96(L) 96(L) 98  CO2 22 - 32 mmol/L 22 23 21(L)  Calcium 8.9 - 10.3 mg/dL 1.54) 0.0(Q) 9.0   Chest imaging: CTA Chest 02/16/20 1. No CT evidence of central pulmonary artery embolus. 2. Multifocal pneumonia and in keeping with COVID-19. 3. Small bilateral  upper lobe pulmonary lacerations. No pneumothorax. 4. Fatty liver  PFT: No flowsheet data found.  Echo: 01/28/11 - Left ventricle: The cavity size was normal. Wall thickness  was at the upper limits of normal. Systolic function was  normal. The estimated ejection fraction was in the range  of 55% to 60%. Wall motion was normal; there were no  regional wall motion abnormalities. Doppler parameters are  consistent with abnormal left ventricular relaxation  (grade 1 diastolic dysfunction).  - Mitral valve: Trivial regurgitation.  - Left atrium: The atrium was at the upper limits of normal  in size.  - Right atrium: The atrium was at the upper limits of normal  in size. 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  - Atrial septum: A patent foramen ovale cannot be excluded.  - Tricuspid valve: Mild regurgitation.  - Pericardium, extracardiac: There was no pericardial  effusion.   Lower Extremity US 02/16/20 Negative for DVT in the left and right lower extremities    Assessment & Plan:   Pneumonia due to COVID-19 virus  Obesity, Class III, BMI 40-49.9 (morbid obesity) (HCC)  Snoring  Discussion: Agustus Mane is a 35 year old male, never smoker who is referred to pulmonary clinic for hospital follow up for Covid pneumonia and respiratory failure.   He has had a great recovery since his hospitalization in 02/2020. He has persistent exertional dyspnea  which is likely related to deconditioning along with any residual underlying parenchymal/vascular defects from the covid 19 pneumonia. There also could be a component of obstructive sleep apnea. Fortunately he does not have oxygen desaturations with ambulation in the office today. He has been instructed to have a copy of the sleep study sent to our clinic for review. He can continue on the albuterol and combivent inhalers as needed.   We have encouraged him to get the covid 19 vaccine when able and to wear an N95 mask while at work due to the dust and mold exposures reported there, at least until he is further out from his infection.     Follow up in April.  Melody Comas, MD [facility] Office: [phone]   See Amion for Pager Details    Current Outpatient Medications:    acetaminophen (TYLENOL) 500 MG tablet, Take 500 mg by mouth every 6 (six) hours as needed for mild pain., Disp: , Rfl:    Ascorbic Acid (VITAMIN C) 1000 MG tablet, Take 1,000 mg by mouth daily., Disp: , Rfl:    Cholecalciferol 125 MCG (5000 UT) TABS, Vitamin D3 125 mcg (5,000 unit) tablet  Take 1 tablet every day by oral route as directed for 110 days., Disp: , Rfl:    ELDERBERRY PO, Take 1 capsule by mouth daily., Disp: , Rfl:    Omega-3 Fatty Acids (FISH OIL) 1000 MG CAPS, Take 1,000 mg by mouth daily., Disp: , Rfl:   Addendum 04/16/2020 Home Sleep Study Report AHI 25.1/hr Lowest desaturation to 72%, 176 minutes were below 90% Moderate Obstructive sleep apnea Ordered for auto titrating CPAP 4-20cmH2O

## 2020-04-08 NOTE — Patient Instructions (Addendum)
Continue albuterol as needed every 4-6 hours for cough, shortness of breath, wheezing.   Continue combivent as needed every 6 hours as needed for cough, shortness of breath,  And wheezing.   Have sleep study results sent to our clinic.    Follow up in April

## 2021-05-21 IMAGING — DX DG CHEST 1V PORT
1 series · 1 of 1 positions shown · non-contrast
Comparison: None.

CLINICAL DATA: Hypoxia and shortness of breath

EXAM:
PORTABLE CHEST 1 VIEW

[chest ap]
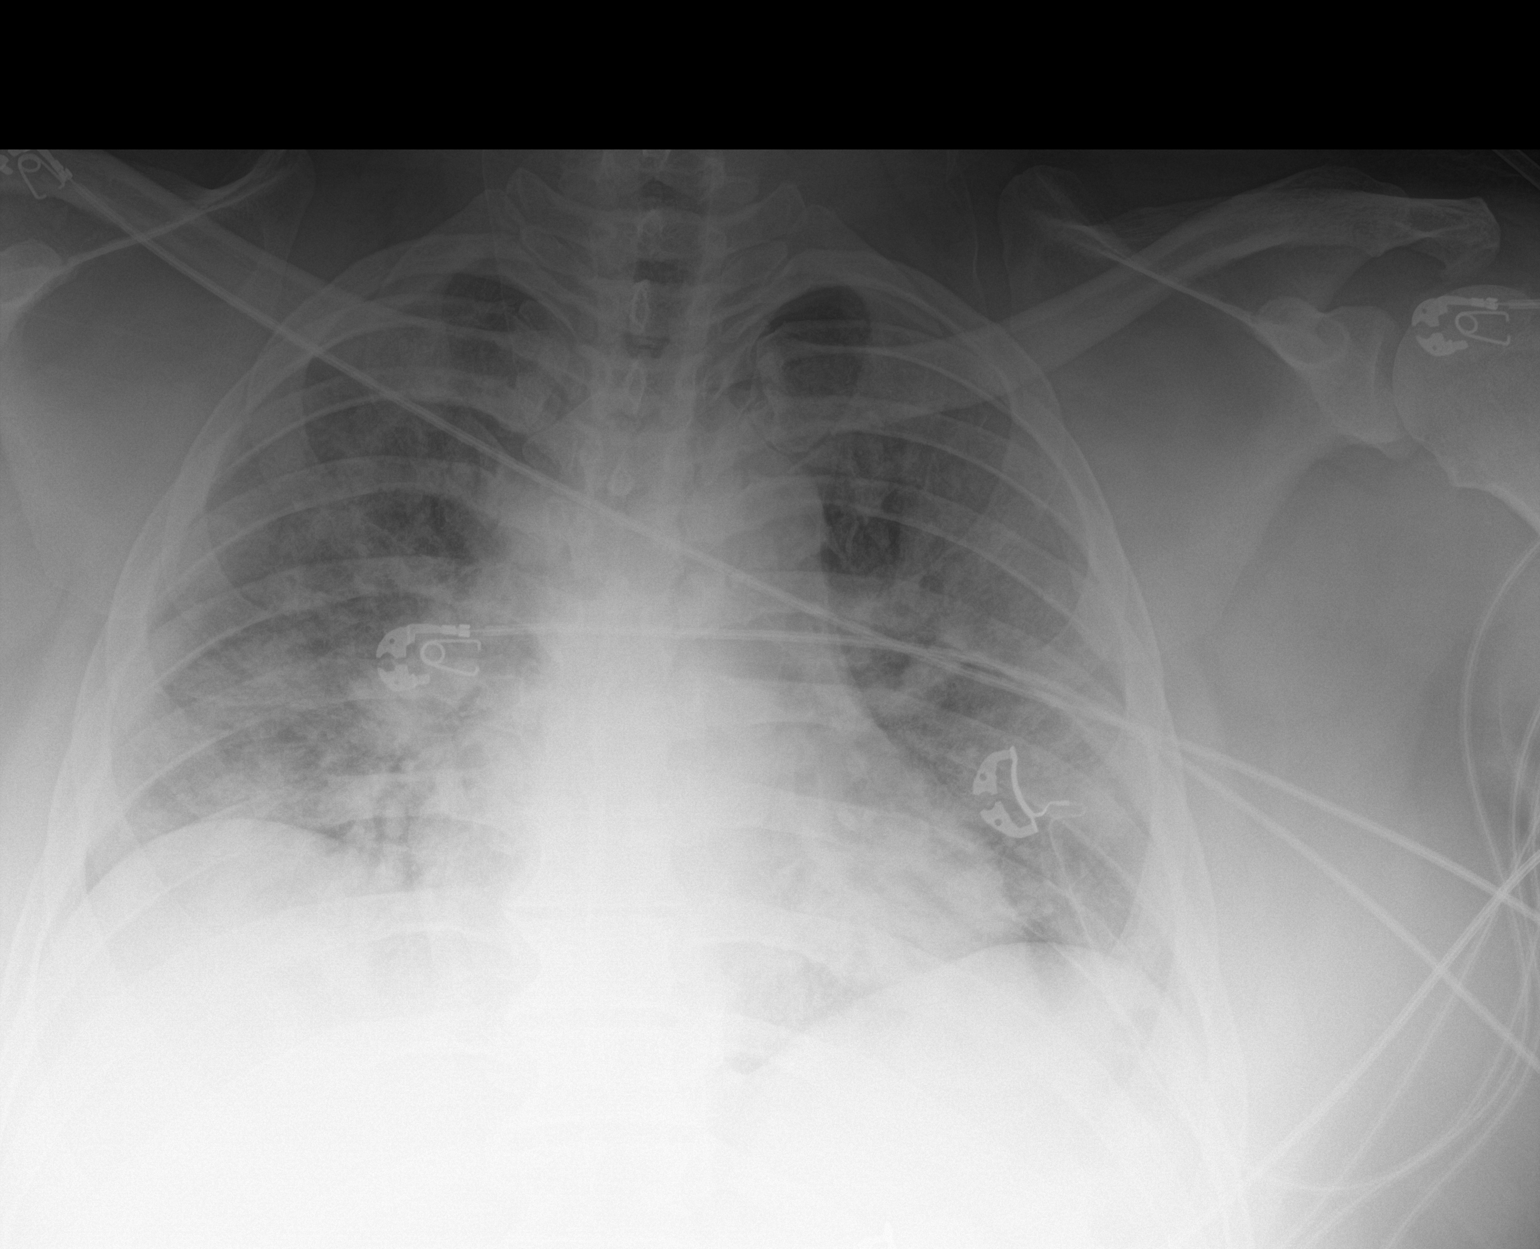

[1 of 1 positions shown; findings below may reference images not displayed]

FINDINGS: There is airspace opacity throughout the lungs bilaterally without
appreciable consolidation. Heart is upper normal in size with
pulmonary vascularity normal. No adenopathy. No bone lesions.
IMPRESSION: Widespread airspace opacity bilaterally. Suspect multifocal
pneumonia, likely of atypical organism etiology. A degree of
pulmonary edema superimposed cannot be excluded. Heart upper normal
in size. No adenopathy evident.

## 2021-05-23 IMAGING — CT CT ANGIO CHEST
2 of 7 series · 17 of 46 positions shown · IV contrast (APPLIED)
Comparison: Chest CT dated 02/14/2020.

CLINICAL DATA: 34-year-old male with positive D-dimer. Concern for
pulmonary embolism. Positive [id number]-[id number].

EXAM:
CT ANGIOGRAPHY CHEST WITH CONTRAST
TECHNIQUE: Multidetector CT imaging of the chest was performed using the
standard protocol during bolus administration of intravenous
contrast. Multiplanar CT image reconstructions and MIPs were
obtained to evaluate the vascular anatomy.
CONTRAST:  100mL OMNIPAQUE IOHEXOL 350 MG/ML SOLN

[Series 5: thins · axial · 0.80mm/px · z∈[-518,-262]mm · 15 of 294 slices shown]
[im 19/294  lung]
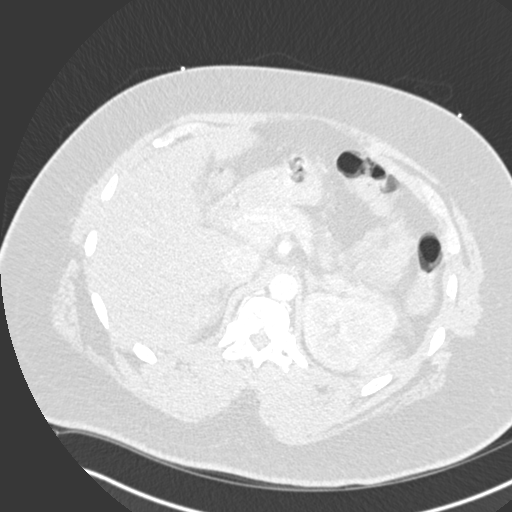
[im 37/294  soft-tissue]
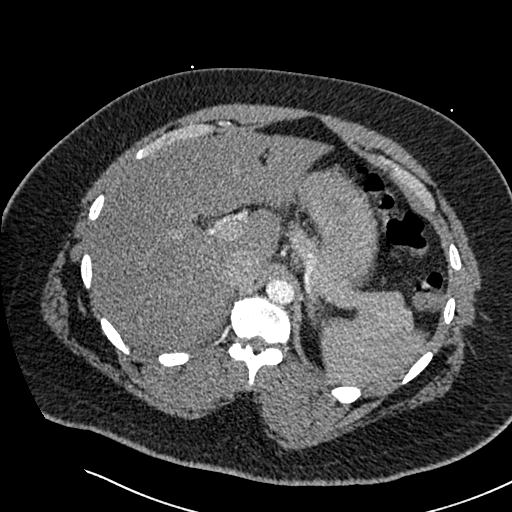
[im 55/294  lung]
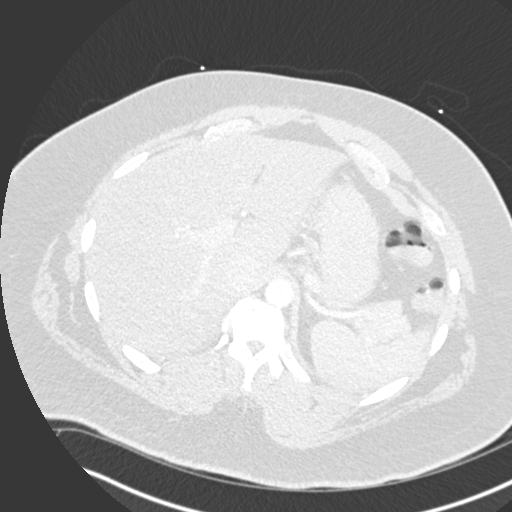
[im 74/294  soft-tissue]
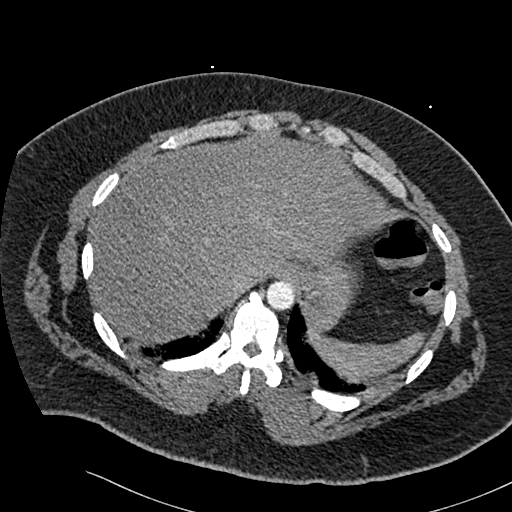
[im 92/294  lung]
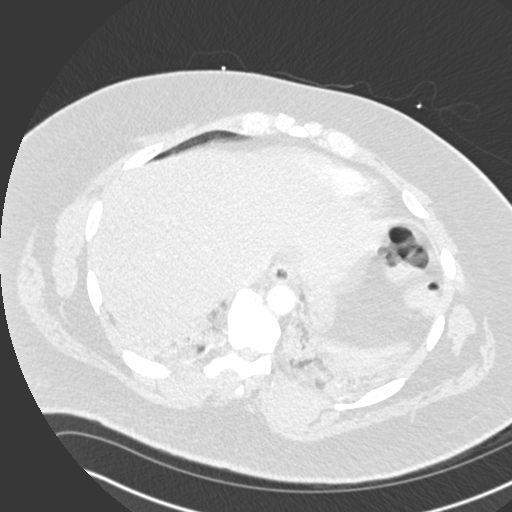
[im 110/294  soft-tissue]
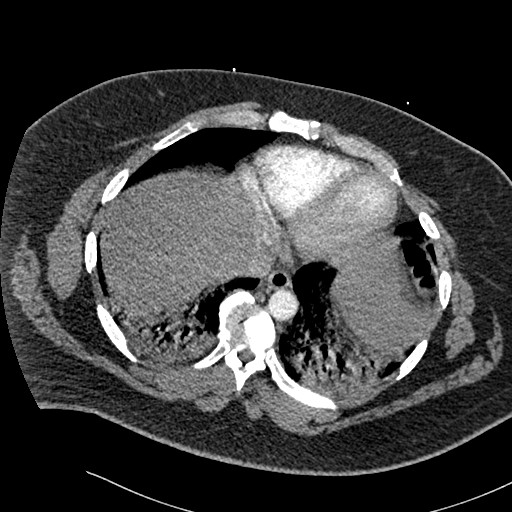
[im 129/294  lung]
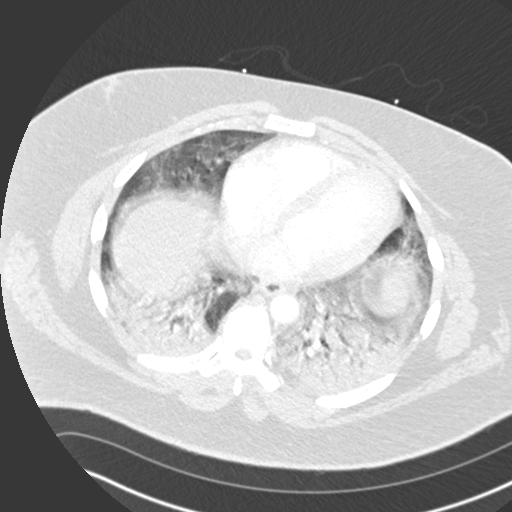
[im 147/294  soft-tissue]
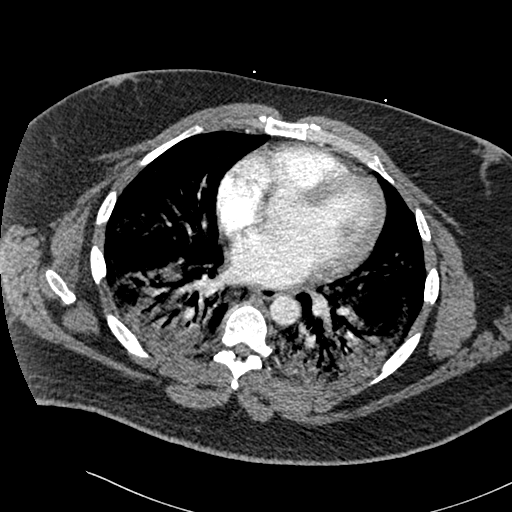
[im 165/294  lung]
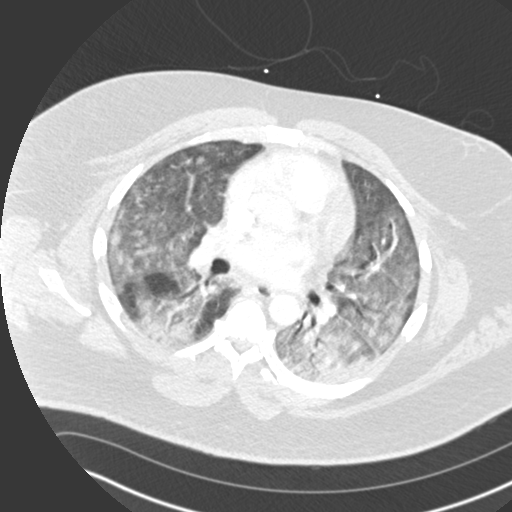
[im 184/294  soft-tissue]
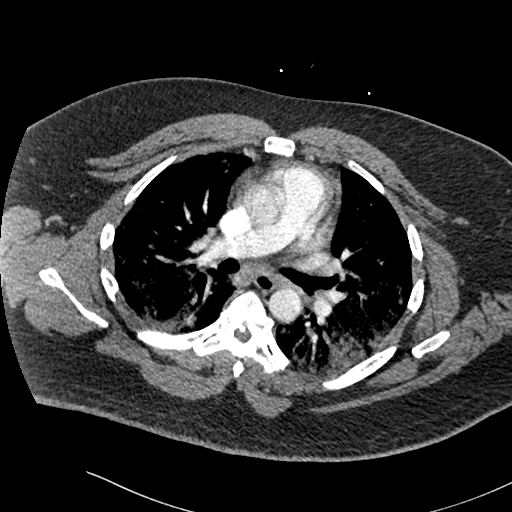
[im 202/294  lung]
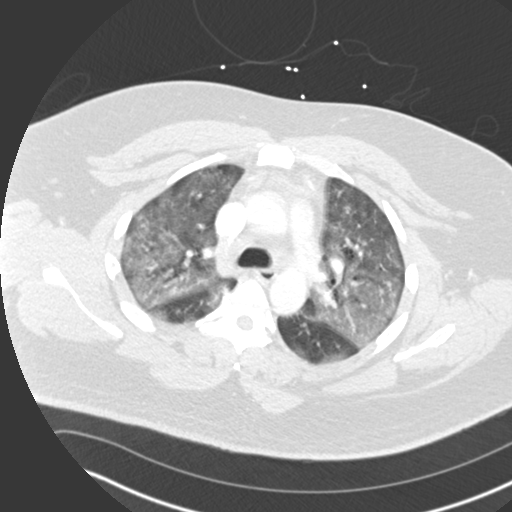
[im 220/294  soft-tissue]
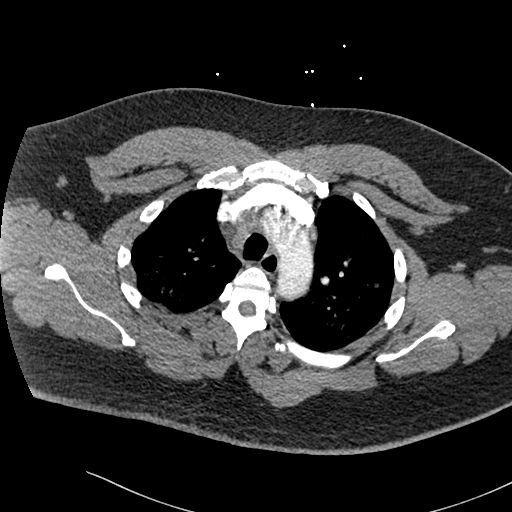
[im 239/294  lung]
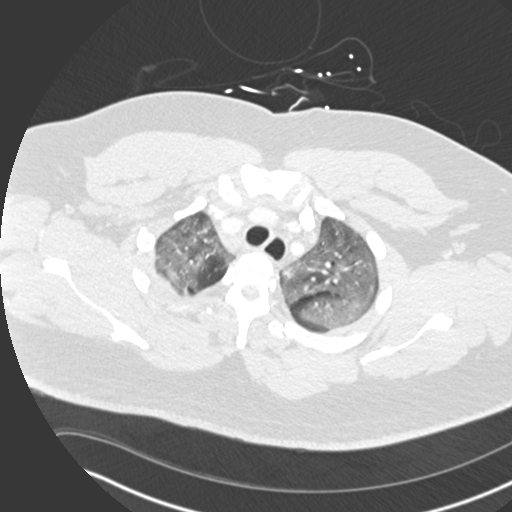
[im 257/294  soft-tissue]
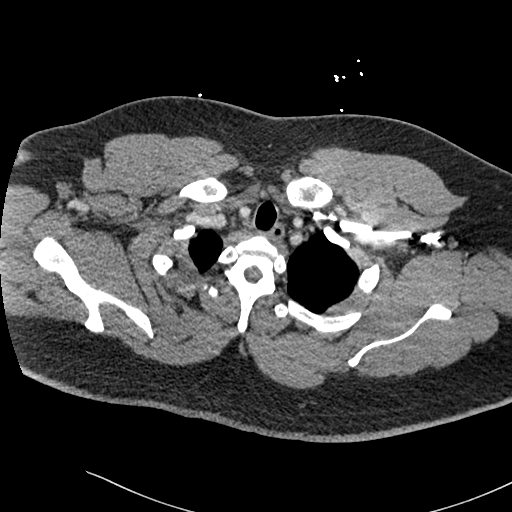
[im 275/294  lung]
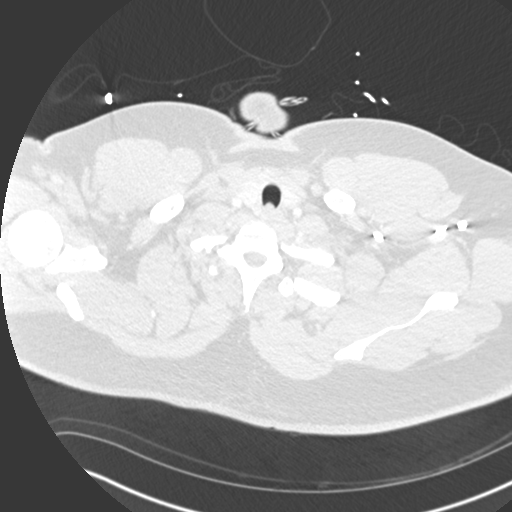

[Series 7: coronal mpr · coronal · 0.58mm/px · 2 of 93 slices shown]
[im 31/93  soft-tissue]
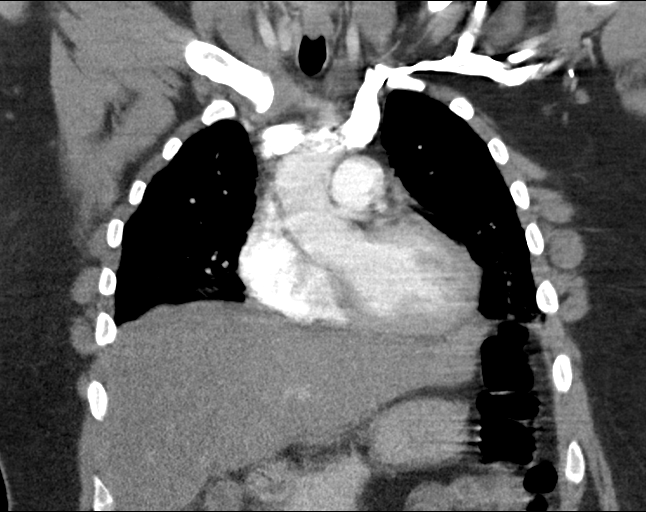
[im 62/93  soft-tissue]
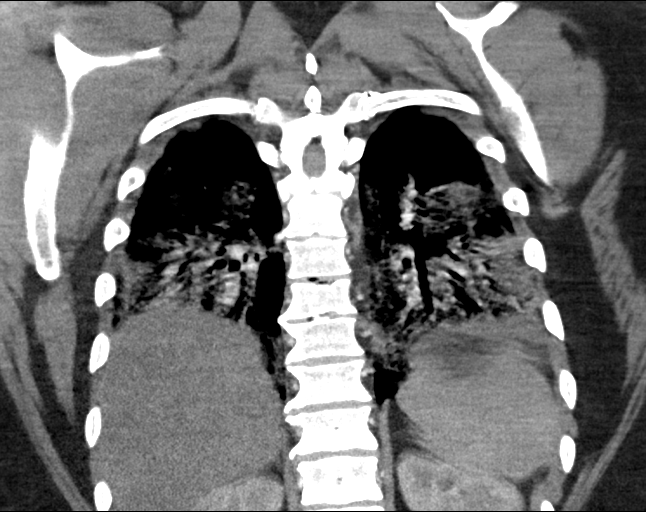

[17 of 46 positions shown; findings below may reference images not displayed]

FINDINGS: Evaluation of this exam is limited due to respiratory motion
artifact.

Cardiovascular: There is no cardiomegaly or pericardial effusion.
The thoracic aorta is unremarkable. The origins of the great vessels
of the aortic arch appear patent as visualized. Evaluation of the
pulmonary arteries is very limited due to severe respiratory motion
artifact. No definite large central pulmonary artery embolus
identified.

Mediastinum/Nodes: No obvious hilar adenopathy. Top-normal
subcarinal lymph nodes, likely reactive. The esophagus and the
thyroid gland are grossly unremarkable. No mediastinal fluid
collection.

Lungs/Pleura: Diffuse bilateral pulmonary opacities consistent with
multifocal pneumonia and in keeping with 3FCJW-[id number]. Small bilateral
upper lobe pulmonary lacerations ([REDACTED]). There is no pleural
effusion pneumothorax. The central airways are patent.

Upper Abdomen: Fatty liver.

Musculoskeletal: Degenerative changes of the spine. No acute osseous
pathology.

Review of the MIP images confirms the above findings.
IMPRESSION: 1. No CT evidence of central pulmonary artery embolus.
2. Multifocal pneumonia and in keeping with 3FCJW-[id number].
3. Small bilateral upper lobe pulmonary lacerations. No
pneumothorax.
4. Fatty liver.
